# Patient Record
Sex: Female | Born: 1945 | Race: Black or African American | Hispanic: No | Marital: Single | Smoking: Never smoker
Health system: Southern US, Community
[De-identification: ages and names within clinical notes are randomized; demographics above are authoritative.]

## PROBLEM LIST (undated history)

## (undated) DIAGNOSIS — Z923 Personal history of irradiation: Secondary | ICD-10-CM

## (undated) DIAGNOSIS — T7840XA Allergy, unspecified, initial encounter: Secondary | ICD-10-CM

## (undated) DIAGNOSIS — Z9221 Personal history of antineoplastic chemotherapy: Secondary | ICD-10-CM

## (undated) DIAGNOSIS — K635 Polyp of colon: Secondary | ICD-10-CM

## (undated) DIAGNOSIS — C801 Malignant (primary) neoplasm, unspecified: Secondary | ICD-10-CM

## (undated) DIAGNOSIS — J189 Pneumonia, unspecified organism: Secondary | ICD-10-CM

## (undated) DIAGNOSIS — D691 Qualitative platelet defects: Secondary | ICD-10-CM

## (undated) DIAGNOSIS — G620 Drug-induced polyneuropathy: Secondary | ICD-10-CM

## (undated) DIAGNOSIS — D649 Anemia, unspecified: Secondary | ICD-10-CM

## (undated) DIAGNOSIS — R599 Enlarged lymph nodes, unspecified: Secondary | ICD-10-CM

## (undated) DIAGNOSIS — K8020 Calculus of gallbladder without cholecystitis without obstruction: Secondary | ICD-10-CM

## (undated) DIAGNOSIS — M1990 Unspecified osteoarthritis, unspecified site: Secondary | ICD-10-CM

## (undated) HISTORY — PX: COLONOSCOPY: SHX174

## (undated) HISTORY — DX: Allergy, unspecified, initial encounter: T78.40XA

## (undated) HISTORY — DX: Calculus of gallbladder without cholecystitis without obstruction: K80.20

## (undated) HISTORY — DX: Anemia, unspecified: D64.9

## (undated) HISTORY — DX: Enlarged lymph nodes, unspecified: R59.9

## (undated) HISTORY — DX: Unspecified osteoarthritis, unspecified site: M19.90

## (undated) HISTORY — DX: Pneumonia, unspecified organism: J18.9

## (undated) HISTORY — DX: Polyp of colon: K63.5

## (undated) HISTORY — DX: Qualitative platelet defects: D69.1

## (undated) HISTORY — DX: Drug-induced polyneuropathy: G62.0

---

## 1973-10-22 HISTORY — PX: TONSILLECTOMY: SUR1361

## 1986-10-22 HISTORY — PX: ABDOMINAL HYSTERECTOMY: SHX81

## 1995-10-23 DIAGNOSIS — C801 Malignant (primary) neoplasm, unspecified: Secondary | ICD-10-CM

## 1995-10-23 HISTORY — DX: Malignant (primary) neoplasm, unspecified: C80.1

## 1996-10-22 HISTORY — PX: BREAST SURGERY: SHX581

## 1996-10-22 HISTORY — PX: BREAST LUMPECTOMY: SHX2

## 1998-10-20 ENCOUNTER — Other Ambulatory Visit: Admission: RE | Admit: 1998-10-20 | Discharge: 1998-10-20 | Payer: Self-pay | Admitting: Obstetrics and Gynecology

## 1999-10-11 ENCOUNTER — Encounter: Payer: Self-pay | Admitting: Obstetrics and Gynecology

## 1999-10-11 ENCOUNTER — Encounter: Admission: RE | Admit: 1999-10-11 | Discharge: 1999-10-11 | Payer: Self-pay | Admitting: Obstetrics and Gynecology

## 1999-10-20 ENCOUNTER — Other Ambulatory Visit: Admission: RE | Admit: 1999-10-20 | Discharge: 1999-10-20 | Payer: Self-pay | Admitting: Obstetrics and Gynecology

## 2000-05-30 ENCOUNTER — Encounter: Admission: RE | Admit: 2000-05-30 | Discharge: 2000-05-30 | Payer: Self-pay | Admitting: Family Medicine

## 2000-07-12 ENCOUNTER — Encounter: Admission: RE | Admit: 2000-07-12 | Discharge: 2000-09-05 | Payer: Self-pay | Admitting: General Surgery

## 2000-10-03 ENCOUNTER — Other Ambulatory Visit: Admission: RE | Admit: 2000-10-03 | Discharge: 2000-10-03 | Payer: Self-pay | Admitting: Obstetrics and Gynecology

## 2000-10-08 ENCOUNTER — Encounter: Admission: RE | Admit: 2000-10-08 | Discharge: 2000-10-08 | Payer: Self-pay | Admitting: Family Medicine

## 2001-05-23 ENCOUNTER — Encounter (HOSPITAL_BASED_OUTPATIENT_CLINIC_OR_DEPARTMENT_OTHER): Payer: Self-pay | Admitting: General Surgery

## 2001-05-23 ENCOUNTER — Encounter: Admission: RE | Admit: 2001-05-23 | Discharge: 2001-05-23 | Payer: Self-pay | Admitting: General Surgery

## 2001-06-26 ENCOUNTER — Encounter (INDEPENDENT_AMBULATORY_CARE_PROVIDER_SITE_OTHER): Payer: Self-pay | Admitting: *Deleted

## 2001-06-26 ENCOUNTER — Ambulatory Visit (HOSPITAL_COMMUNITY): Admission: RE | Admit: 2001-06-26 | Discharge: 2001-06-26 | Payer: Self-pay | Admitting: General Surgery

## 2001-12-16 ENCOUNTER — Encounter: Admission: RE | Admit: 2001-12-16 | Discharge: 2001-12-16 | Payer: Self-pay | Admitting: Internal Medicine

## 2001-12-16 ENCOUNTER — Encounter: Payer: Self-pay | Admitting: Internal Medicine

## 2001-12-18 ENCOUNTER — Encounter: Admission: RE | Admit: 2001-12-18 | Discharge: 2001-12-18 | Payer: Self-pay | Admitting: Internal Medicine

## 2001-12-18 ENCOUNTER — Encounter: Payer: Self-pay | Admitting: Internal Medicine

## 2002-05-05 ENCOUNTER — Other Ambulatory Visit: Admission: RE | Admit: 2002-05-05 | Discharge: 2002-05-05 | Payer: Self-pay | Admitting: Obstetrics and Gynecology

## 2002-07-29 ENCOUNTER — Encounter: Payer: Self-pay | Admitting: Emergency Medicine

## 2002-07-29 ENCOUNTER — Emergency Department (HOSPITAL_COMMUNITY): Admission: EM | Admit: 2002-07-29 | Discharge: 2002-07-30 | Payer: Self-pay | Admitting: Emergency Medicine

## 2003-06-22 ENCOUNTER — Inpatient Hospital Stay (HOSPITAL_COMMUNITY): Admission: EM | Admit: 2003-06-22 | Discharge: 2003-06-25 | Payer: Self-pay | Admitting: Emergency Medicine

## 2003-06-22 ENCOUNTER — Encounter: Payer: Self-pay | Admitting: Emergency Medicine

## 2004-11-23 ENCOUNTER — Encounter: Admission: RE | Admit: 2004-11-23 | Discharge: 2004-11-23 | Payer: Self-pay | Admitting: Internal Medicine

## 2006-01-30 ENCOUNTER — Encounter: Admission: RE | Admit: 2006-01-30 | Discharge: 2006-01-30 | Payer: Self-pay | Admitting: Obstetrics and Gynecology

## 2006-02-21 ENCOUNTER — Encounter: Admission: RE | Admit: 2006-02-21 | Discharge: 2006-02-21 | Payer: Self-pay | Admitting: Obstetrics and Gynecology

## 2006-10-22 HISTORY — PX: CHOLECYSTECTOMY: SHX55

## 2007-04-16 ENCOUNTER — Encounter (INDEPENDENT_AMBULATORY_CARE_PROVIDER_SITE_OTHER): Payer: Self-pay | Admitting: General Surgery

## 2007-04-16 ENCOUNTER — Observation Stay (HOSPITAL_COMMUNITY): Admission: RE | Admit: 2007-04-16 | Discharge: 2007-04-17 | Payer: Self-pay | Admitting: General Surgery

## 2008-08-03 ENCOUNTER — Encounter: Admission: RE | Admit: 2008-08-03 | Discharge: 2008-08-03 | Payer: Self-pay | Admitting: Obstetrics and Gynecology

## 2009-09-27 ENCOUNTER — Encounter: Admission: RE | Admit: 2009-09-27 | Discharge: 2009-09-27 | Payer: Self-pay | Admitting: Internal Medicine

## 2010-05-22 ENCOUNTER — Emergency Department (HOSPITAL_COMMUNITY): Admission: EM | Admit: 2010-05-22 | Discharge: 2010-05-22 | Payer: Self-pay | Admitting: Family Medicine

## 2010-05-23 ENCOUNTER — Encounter: Admission: RE | Admit: 2010-05-23 | Discharge: 2010-05-23 | Payer: Self-pay | Admitting: Otolaryngology

## 2010-10-03 ENCOUNTER — Encounter
Admission: RE | Admit: 2010-10-03 | Discharge: 2010-10-03 | Payer: Self-pay | Source: Home / Self Care | Attending: Internal Medicine | Admitting: Internal Medicine

## 2011-03-06 NOTE — H&P (Signed)
NAMESEYNABOU, [name]               ACCOUNT NO.:  [id number]   MEDICAL RECORD NO.:  [id number]          PATIENT TYPE:  OUT   LOCATION:  DFTL                         FACILITY:  [facility]   PHYSICIAN:  [name], M.D.DATE OF BIRTH:  05/22/1946   DATE OF ADMISSION:  04/16/2007  DATE OF DISCHARGE:                              HISTORY & PHYSICAL   CHIEF COMPLAINT:  Right upper quadrant abdominal pain.   PRESENT ILLNESS:  [name] is a 60-year black female who  approximately three to four days ago had the onset of aching, right  upper quadrant abdominal pain with nausea.  This initially was not  particularly severe and she treated this with over-the-counter  medication.  The pain persisted, however, and last night became much  more severe.  She describes constant severe aching under her right rib  cage associated with nausea.  It is worse with moving.  She has loss of  appetite.  She has no similar previous symptoms.  She was seen at  [facility] stating that gallbladder ultrasound was  obtained showing evidence of acute cholecystitis as described below.   PAST MEDICAL HISTORY/SURGERY:  1. History of left breast cancer status post lumpectomy, axillary      dissection, radiation chemotherapy 10 years ago.  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  2. Tonsillectomy.  3. Hysterectomy.  4. Medical, she denies serious medical illness or hospitalizations.   MEDICATIONS:  1. P.r.n. Lasix for her arm swelling.   ALLERGIES:  CODEINE.   SOCIAL HISTORY:  She is divorced.  No cigarette or alcohol use.   FAMILY HISTORY:  She has a sister status post cholecystectomy.   REVIEW OF SYSTEMS:  GENERAL:  She felt somewhat feverish today, weight  stable.  RESPIRATORY:  Denies shortness breath, cough, wheezing.  CARDIAC: Denies chest pain, palpitations, history of heart disease.  ABDOMEN/GI as above.  GU: No urinary burning, frequency.   PHYSICAL  EXAMINATION:  VITAL SIGNS:  She is afebrile.  Heart rate 88,  respirations 14, blood pressure 169/94.  GENERAL:  Mildly obese black female appears uncomfortable.  SKIN:  Warm, dry.  No rash or infection.  HEENT: No palpable masses or thyromegaly.  Sclerae nonicteric.  Nares,  oropharynx clear.  LYMPHS:  Lymph nodes no cervical, subclavicular, axillary or inguinal  nodes palpable.  BREASTS:  Status post lumpectomy on the left.  No palpable masses in  either breast.  RESPIRATORY:  No shortness of breath, cough, wheezing.  CARDIAC:  Regular rate and rhythm.  No murmurs.  There is 3+ edema of  the left arm.  ABDOMEN:  Local tenderness in the right upper quadrant without guarding,  masses or peritoneal signs.  EXTREMITIES:  Lymphedema left arm as noted above.  NEUROLOGIC:  Alert, oriented, motor and sensory exams grossly normal.   LABORATORY:  CBC, LFTs, lipase, UA all within normal limits.  Ultrasound  of the gallbladder shows multiple large gallstones with a stone impacted  in the neck of the gallbladder and thickening of the gallbladder wall,  no  pericholecystic fluid, common bile duct normal.   ASSESSMENT/PLAN:  Cholelithiasis, probable acute cholecystitis.  The  patient will be admitted up.  She had been treated with intravenous  fluids, antibiotics will be taken urgently to operating room for  laparoscopic cholecystectomy.      [name], M.D.  Electronically Signed     BTH/MEDQ  D:  04/16/2007  T:  04/17/2007  Job:  [id number]

## 2011-03-06 NOTE — Op Note (Signed)
[name], [name]               ACCOUNT NO.:  [id number]   MEDICAL RECORD NO.:  [id number]          PATIENT TYPE:  OUT   LOCATION:  DFTL                         FACILITY:  [facility]   PHYSICIAN:  Sharlet Salina T. Hoxworth, M.D.DATE OF BIRTH:  1946-03-29   DATE OF PROCEDURE:  04/16/2007  DATE OF DISCHARGE:                               OPERATIVE REPORT   PRE-AND-POSTOPERATIVE DIAGNOSIS:  Cholelithiasis and acute  cholecystitis.   SURGICAL PROCEDURES:  Laparoscopic cholecystectomy with intraoperative  cholangiogram.   SURGEON:  Sharlet Salina T. Hoxworth, M.D.   ANESTHESIA:  General.   BRIEF HISTORY:  [name] is a 65 year old female who presents with  persistent right upper quadrant abdominal pain and nausea.  She had a  gallbladder ultrasound, today, showing multiple gallstones and  thickening of the gallbladder wall.  LFTs are normal.  She is felt to  have acute cholecystitis and laparoscopic cholecystectomy with  cholangiogram has been recommend and accepted.  The nature of the  procedure, its indications, the risks of bleeding, infection, bile leak  and bile duct injury were discussed and understood. She is now brought  to the operating room for this procedure.   DESCRIPTION OF OPERATION:  The patient brought to the operating room and  placed in the supine position on the operating table; and general  orotracheal anesthesia was induced.  The abdomen was widely sterilely  prepped and draped.  PAS were in place.  She received preoperative  antibiotics.  The correct patient and procedure were verified.   Local anesthesia was used to infiltrate the trocar sites.  A 1-cm  incision was made at the umbilicus.  Dissection was carried down through  the subcutaneous tissue to the midline fascia which was sharply incised  transversely for 1 cm where the peritoneum was entered under direct  vision.  Through a mattress suture of #0 Vicryl the Hasson trocar was  placed and pneumoperitoneum  established.  Under direct vision, a 10-mm  trocar was placed in the subxiphoid area; and two 5-mm trocars on the  right subcostal margin.   The gallbladder was acutely inflamed and edematous.  It was tense and  was decompressed with the aspiration needle, obtaining clear bile.  The  fundus was then grasped and elevated up over the liver.  Filmy adhesions  were taken down off the infundibulum which was retracted  inferolaterally.  Peritoneum anterior and posterior to Calot's triangle  was incised; and fibrofatty tissue was stripped off the neck of the  gallbladder toward the porta hepatis.   The cystic duct gallbladder junction was identified and dissected 360  degrees; and the cystic duct dissected out over about a centimeter.  When the anatomy was clear, the cystic duct was clipped at the  gallbladder junction and an operative cholangiogram obtained through the  cystic duct.  This showed good filling of normal common bile duct and  intrahepatic ducts with free flow into the duodenum and no filling  defects.  Following this the cystic duct was doubly clipped proximally  and divided.  The cystic artery clearly seen coursing up on the  gallbladder wall  was divided between two proximal and one distal clip.  The gallbladder was then dissected free from its bed using hook cautery.  It was placed in an EndoCatch bag and brought out through the umbilicus  after extracting a number of large stones.   The gallbladder bed was irrigated and complete hemostasis was obtained  with cautery.  Surgicel pack was additionally placed.  The end was  inspected for any evidence of trocar injury and none was seen.  The  trocar was removed and all CO2 evacuated.  The mattress suture was  secured to the umbilicus.  Skin incisions were closed with interrupted 4-  0 Monocryl subcuticular and Dermabond.  Sponge, needle, and instrument  counts were correct.  The patient was taken to recovery in good   condition.      Lorne Skeens. Hoxworth, M.D.  Electronically Signed     BTH/MEDQ  D:  04/16/2007  T:  04/17/2007  Job:  [id number]

## 2011-03-09 NOTE — Discharge Summary (Signed)
NAME:  [name], BACHTEL                         ACCOUNT NO.:  [id number]   MEDICAL RECORD NO.:  [id number]                   PATIENT TYPE:  INP   LOCATION:  0472                                 FACILITY:  [facility]   PHYSICIAN:  Maxwell Caul, M.D.             DATE OF BIRTH:  12/05/1945   DATE OF ADMISSION:  06/22/2003  DATE OF DISCHARGE:  06/25/2003                                 DISCHARGE SUMMARY   CHIEF COMPLAINT:  Vomiting and chills.   DISCHARGE DIAGNOSES:  1. Left arm cellulitis/phlebitis, improving current antibiotic therapy.     a. An upper extremity arterial Doppler study found no evidence of deep        venous thrombosis or superficial thrombus.  2. Acute renal insufficiency/dehydration with hypokalemia secondary to     nausea and vomiting.  Hypokalemia secondary to nausea and vomiting.     Intravenous fluids, Lasix prior to admission.     a. Renal function normalized to baseline and potassium 3.7 at discharge.  3. Nausea and vomiting secondary to infection and possible esophagitis,     resolved.  No complaints at discharge.     a. Placed on proton pump inhibitor, Protonix.  Will continue post        discharge.  4. Mild normocytic anemia with likely component iron deficiency.     a. Started on iron replacement prior to discharge.  5. History left breast cancer with previous lumpectomy status post radiation     therapy, status post chemotherapy.     a. Multiple lymph node dissection during lumpectomy procedure in 1998.     b. Followed as an outpatient by oncology.  6. Previous surgical history includes tonsillectomy, partial hysterectomy     1988 secondary to fibroids, lumpectomy.  7. Allergies listed Darvocet.   DISCHARGE MEDICATIONS:  1. Protonix 40 mg daily.  2. Keflex 500 mg q.i.d. for 10 days post discharge.  3. Ferrous sulfate 325 mg b.i.d.  4. The patient to resume Lasix at previous dosage.   SPECIAL DISCHARGE INSTRUCTIONS:  1. Disposition:  The patient  discharged home where she lives independently.  2. Activity:  Unrestricted.  3. Diet:  Regular.  4. Follow up with Dr. Andrey Campanile at [facility] 10-14 days post     discharge.  5. Follow up with her oncologist as previously arranged.  6. Next office visit [facility] would follow with basic     metabolic panel and a CBC.  Reevaluate serum potassium, electrolytes,     renal function, anemia.  7. The patient to wrap left upper extremity and utilize compression sleeve     as previously ordered by surgery and oncology for left arm lymphedema.   CONSULTS:  None.   PROCEDURES:  Upper extremity arterial Doppler study:  Negative deep venous  thrombosis done June 22, 2003.   DISCHARGE LABORATORIES:  A CBC done June 25, 2003 found WBC 4.4,  hemoglobin 11.1, hematocrit 32.9, platelets 151,000.  Blood cultures  produced no growth over the course of hospitalization.  Stool for occult  blood negative.  B12 and folate levels are pending.  Anemia iron study found  iron low 18, binding capacity low 216, iron saturation low 8.  Chest x-ray  on admission noted marked accentuated left upper lobe with no definite  pneumonia present.   For dictated admission history of present illness, past medical history,  medications, social history, family history, review of systems, physical  examination, laboratory data, impressions, and plan, please see dictated  admission history and physical dated June 22, 2003.   HOSPITAL COURSE:  Problem 1 - LEFT ARM CELLULITIS/PHLEBITIS:  This 65-year-  old female is followed as an outpatient in primary care by Dr. Andrey Campanile at  [facility].  She has a history of left breast cancer and  previous lumpectomy with chronic lymphedema status post multiple lymph node  dissection.  She experienced sudden onset vomiting, chills on June 21, 2003.  She came to the emergency room for the vomiting where she was noted  to be febrile  101 and noted to have a severe cellulitis left upper  extremity.  She had a similar episode of cellulitis left upper extremity  approximately one year ago.  Following her previous lumpectomy, she had  radiation and chemotherapy in 2001.  She denied cough, dysuria, or other  complaints.  She was admitted for further evaluation and treatment.  She was  placed on IV antibiotic therapy with Rocephin initially and then switched to  Ancef.  With antibiotic therapy her fever and leukocytosis resolved.  There  was mild thrombocytopenia and this was thought secondary to her infection.  She was switched to oral Keflex and remains afebrile.  Discharge WBC  normalized to 4.4.  She will complete a total 14-day course of antibiotic  therapy post discharge with Keflex.  She is to follow with Dr. Andrey Campanile at  [facility] in 10-14 days.  The patient did have significant  erythema, warmth, increased, pain in the left upper extremity and this all  resolved.  A venous Doppler study was done June 22, 2003 and this found no  indication of DVT or superficial thrombus.  The patient continues to have  significant left upper extremity lymphedema.  She utilizes a wrap and sleeve  for this as a home treatment and she states she will initiate this with  return to home.  She was also taking diuretics with Lasix at home and she  will resume this as previous.  She is to follow up with her oncologist as  previously arranged.   Problem 2 - DEHYDRATION AND HYPOKALEMIA:  On presentation the patient was  having nausea and vomiting.  She was unable to tolerate oral food and fluids  well.  She was mildly dehydrated, BUN 9 and creatinine 0.8.  She was given  gentle IV hydration until her nausea and vomiting resolved.  She is now  taking oral food and fluids adequately.  BUN and creatinine improved to 6  and 0.7, respectively.  She had hypokalemia.  Serum potassium 3.2 on admission.  This was thought secondary to  combination nausea and vomiting,  IV hydration, use of diuretics at home.  She was replaced in her potassium  and this normalized to 3.8 and is stable at discharge.  Would follow up with  a basic metabolic panel next office visit at [facility].   Problem 3 -  NAUSEA AND VOMITING THOUGHT SECONDARY TO INFECTION AND POSSIBLE  ESOPHAGITIS:  The patient was placed on b.i.d. proton pump inhibitor.  Her  symptomatology resolved with antibiotic therapy and the proton pump  inhibitor.  I have continued Protonix 40 mg daily post discharge.   Problem 4 - MILD NORMOCYTIC ANEMIA:  On presentation hemoglobin was 12.0.  This dropped to a hemoglobin 10.9 with hydration.  An anemia panel was  obtained with all iron indices low.  B12 and RBC folate pending.  Ferritin  was elevated as would be expected during an acute infective process.  I have  placed her on iron replacement with ferrous sulfate 325 mg b.i.d.  Would  follow with a CBC next visit [facility].  Stool was checked  for occult blood and this was negative.  There has been no sign of acute  bleeding here.   Problem 5 - HISTORY LEFT BREAST CANCER AND SIGNIFICANT LEFT UPPER EXTREMITY  LYMPHEDEMA STATUS POST LUMPECTOMY AND MULTIPLE LYMPH NODE DISSECTION:  This  as above and patient to follow with hematology/oncology as previously  arranged.   CONDITION ON DISCHARGE:  Stable/improved.   DISPOSITION:  Returning home where she lives independently.   ACTIVITY:  No restriction.   DISCHARGE PROCESS:  Greater than 30 minutes 8:55 a.m. through 9:40 a.m.     Everett Graff, N.P.                     Maxwell Caul, M.D.    TC/MEDQ  D:  06/25/2003  T:  06/25/2003  Job:  [id number]   cc:   Andrey Campanile, M.D.  [facility]

## 2011-03-09 NOTE — H&P (Signed)
NAME:  [name], [name]                         ACCOUNT NO.:  [id number]   MEDICAL RECORD NO.:  [id number]                   PATIENT TYPE:  OBV   LOCATION:  0447                                 FACILITY:  [facility]   PHYSICIAN:  Evelena Peat, M.D.               DATE OF BIRTH:  1946/02/10   DATE OF ADMISSION:  06/22/2003  DATE OF DISCHARGE:                                HISTORY & PHYSICAL   PRIMARY PHYSICIAN:  Gloriajean Dell. Andrey Campanile, M.D.   CHIEF COMPLAINT:  Vomiting and chills.   HISTORY OF PRESENT ILLNESS:  This is a 65 year old divorced black female  with history of left breast cancer and previous lumpectomy and chronic  lymphedema who had onset of vomiting and chills around 10 p.m. on June 21, 2003.  Her vomiting actually prompted her ER visit and here was noted to  have a fever of 101 and apparently severe cellulitis of the left upper  extremity.  She had had similar episode of cellulitis involving the left  upper extremity about a year ago.  She has had previous lumpectomy as well  as radiation therapy and chemotherapy in the year 2001.  The patient is not  sure when her left upper extremity erythema and warmth were first noted.  She has not had any recent concerns regarding any cough, dysuria, or other  complaints.   PAST MEDICAL HISTORY:  Left breast cancer diagnosed November 2001 with  previous lumpectomy, radiation therapy, and chemotherapy.   PAST SURGICAL HISTORY:  1. Tonsillectomy.  2. Partial hysterectomy 1988 secondary to fibroids.   ALLERGIES:  DARVON causes nausea.   MEDICATIONS:  Lasix orally for lymphedema, dose unknown at this time.   SOCIAL HISTORY:  She is divorced.  She has one daughter age 22 who lives in  [place].  She works at [employer].  She lives with her father in [place].  She is a nonsmoker.  No alcohol  use.   FAMILY HISTORY:  Father has history of hypertension and diabetes.  Mother  has congestive  heart failure.  She had a grandmother with diabetes as well.   REVIEW OF SYSTEMS:  Denies any recent headache, chest pain, dyspnea, cough,  abdominal pain, dysuria, or diarrhea.  She had some transient soreness in  her upper chest region after vomiting, but no hematemesis.   PHYSICAL EXAMINATION:  VITAL SIGNS:  Temperature 101, blood pressure 145/84,  pulse 96, respirations 20, O2 saturation 99% room air.  GENERAL:  The patient is lying supine, sedated after morphine, in no  apparent distress.  SKIN:  Left upper extremity is edematous with increased warmth to touch and  erythema which involves the dorsum of the left hand, the left forearm, left  arm, and extends all the way to the axilla.  HEENT:  Oropharynx is slightly dry, otherwise clear.  TMs are normal.  Pupils are about 2 mm  and minimally reactive following morphine.  Fundi not  seen.  NECK:  Supple without adenopathy.  CHEST:  Clear to auscultation.  HEART:  Regular rhythm and rate with no murmur.  ABDOMEN:  Soft and nontender with no masses.  She has normal bowel sounds.  BREASTS:  Deferred.  PELVIC:  Deferred.  EXTREMITIES:  Left upper extremity as above.  She has normal distal pulses  throughout her upper and lower extremities.  There is no lower extremity  edema.   LABORATORIES:  White count 11.8, 95% neutrophils, hemoglobin 12.0.  Sodium  130, potassium 3.2, BUN 9, creatinine 0.8, glucose 147.   IMPRESSION:  A 65 year old black female with cellulitis involving left upper  extremity.  Her situation is complicated by chronic left upper extremity  lymphedema secondary to previous breast surgery.  She has had some nausea  and vomiting and is unable to keep down oral medications or fluids at this  time.   PLAN:  Will admit and continue IV fluids and IV antibiotics.  She received  Rocephin 1 g IV in the emergency department.  Blood cultures were obtained  and pending.  Hopefully, she can be transitioned to oral antibiotics  soon.                                               Evelena Peat, M.D.    BB/MEDQ  D:  06/22/2003  T:  06/22/2003  Job:  [id number]   cc:   Gloriajean Dell. Andrey Campanile, M.D.  [address]  [place]  [place] [address]  Fax: [phone]   Leonie Man, M.D.  [address]  [place]  [place] [address]  Fax: [phone]

## 2011-04-10 ENCOUNTER — Ambulatory Visit
Admission: RE | Admit: 2011-04-10 | Discharge: 2011-04-10 | Disposition: A | Discharge status: Home / Self Care | Payer: Self-pay | Source: Ambulatory Visit | Attending: Family Medicine | Admitting: Family Medicine

## 2011-04-10 ENCOUNTER — Other Ambulatory Visit: Payer: Self-pay | Admitting: Family Medicine

## 2011-08-08 LAB — CBC
HCT: 42
Hemoglobin: 13.9
MCV: 85.8
Platelets: 187
RBC: 4.9
WBC: 6.3

## 2011-08-08 LAB — COMPREHENSIVE METABOLIC PANEL
Albumin: 4.1
Alkaline Phosphatase: 101
BUN: 6
CO2: 24
Chloride: 102
Creatinine, Ser: 0.56
GFR calc non Af Amer: >60
Potassium: 3.6
Total Bilirubin: 0.8

## 2011-08-08 LAB — URINALYSIS, ROUTINE W REFLEX MICROSCOPIC
Nitrite: NEGATIVE
Protein, ur: NEGATIVE
Urobilinogen, UA: 0.2

## 2011-08-08 LAB — URINE MICROSCOPIC-ADD ON

## 2011-10-18 ENCOUNTER — Other Ambulatory Visit: Payer: Self-pay | Admitting: Obstetrics and Gynecology

## 2011-10-18 DIAGNOSIS — Z780 Asymptomatic menopausal state: Secondary | ICD-10-CM

## 2011-10-18 DIAGNOSIS — Z1231 Encounter for screening mammogram for malignant neoplasm of breast: Secondary | ICD-10-CM

## 2011-11-01 ENCOUNTER — Ambulatory Visit
Admission: RE | Admit: 2011-11-01 | Discharge: 2011-11-01 | Disposition: A | Discharge status: Home / Self Care | Payer: BC Managed Care – PPO | Source: Ambulatory Visit | Attending: Obstetrics and Gynecology | Admitting: Obstetrics and Gynecology

## 2011-11-01 DIAGNOSIS — Z1231 Encounter for screening mammogram for malignant neoplasm of breast: Secondary | ICD-10-CM

## 2011-11-05 ENCOUNTER — Other Ambulatory Visit: Payer: BC Managed Care – PPO

## 2011-11-05 ENCOUNTER — Ambulatory Visit: Payer: BC Managed Care – PPO

## 2011-11-05 ENCOUNTER — Ambulatory Visit
Admission: RE | Admit: 2011-11-05 | Discharge: 2011-11-05 | Disposition: A | Discharge status: Home / Self Care | Payer: BC Managed Care – PPO | Source: Ambulatory Visit | Attending: Obstetrics and Gynecology | Admitting: Obstetrics and Gynecology

## 2011-11-05 DIAGNOSIS — Z780 Asymptomatic menopausal state: Secondary | ICD-10-CM

## 2012-10-03 ENCOUNTER — Other Ambulatory Visit: Payer: Self-pay | Admitting: Internal Medicine

## 2012-10-03 DIAGNOSIS — Z1231 Encounter for screening mammogram for malignant neoplasm of breast: Secondary | ICD-10-CM

## 2012-11-04 ENCOUNTER — Ambulatory Visit
Admission: RE | Admit: 2012-11-04 | Discharge: 2012-11-04 | Disposition: A | Discharge status: Home / Self Care | Payer: 59 | Source: Ambulatory Visit | Attending: Internal Medicine | Admitting: Internal Medicine

## 2012-11-04 DIAGNOSIS — Z1231 Encounter for screening mammogram for malignant neoplasm of breast: Secondary | ICD-10-CM

## 2012-12-03 ENCOUNTER — Ambulatory Visit: Payer: 59 | Admitting: Physical Therapy

## 2012-12-15 ENCOUNTER — Ambulatory Visit: Payer: 59 | Admitting: Physical Therapy

## 2012-12-16 ENCOUNTER — Ambulatory Visit: Payer: Worker's Compensation | Attending: Family Medicine | Admitting: Physical Therapy

## 2012-12-17 ENCOUNTER — Ambulatory Visit: Payer: 59 | Attending: Family Medicine | Admitting: Physical Therapy

## 2012-12-17 DIAGNOSIS — M25569 Pain in unspecified knee: Secondary | ICD-10-CM | POA: Insufficient documentation

## 2012-12-17 DIAGNOSIS — IMO0001 Reserved for inherently not codable concepts without codable children: Secondary | ICD-10-CM | POA: Insufficient documentation

## 2013-12-14 ENCOUNTER — Other Ambulatory Visit: Payer: Self-pay

## 2013-12-14 DIAGNOSIS — Z1231 Encounter for screening mammogram for malignant neoplasm of breast: Secondary | ICD-10-CM

## 2013-12-24 ENCOUNTER — Ambulatory Visit
Admission: RE | Admit: 2013-12-24 | Discharge: 2013-12-24 | Disposition: A | Discharge status: Home / Self Care | Payer: Medicare Other | Source: Ambulatory Visit

## 2013-12-24 DIAGNOSIS — Z1231 Encounter for screening mammogram for malignant neoplasm of breast: Secondary | ICD-10-CM

## 2014-11-29 ENCOUNTER — Other Ambulatory Visit: Payer: Self-pay

## 2014-11-29 DIAGNOSIS — Z1231 Encounter for screening mammogram for malignant neoplasm of breast: Secondary | ICD-10-CM

## 2014-11-29 DIAGNOSIS — Z853 Personal history of malignant neoplasm of breast: Secondary | ICD-10-CM

## 2014-12-27 ENCOUNTER — Ambulatory Visit: Payer: Medicare Other

## 2015-09-14 ENCOUNTER — Other Ambulatory Visit: Payer: Self-pay | Admitting: Internal Medicine

## 2015-09-14 DIAGNOSIS — Z780 Asymptomatic menopausal state: Secondary | ICD-10-CM

## 2015-10-13 ENCOUNTER — Ambulatory Visit
Admission: RE | Admit: 2015-10-13 | Discharge: 2015-10-13 | Disposition: A | Discharge status: Home / Self Care | Payer: Medicare Other | Source: Ambulatory Visit

## 2015-10-13 DIAGNOSIS — Z853 Personal history of malignant neoplasm of breast: Secondary | ICD-10-CM

## 2015-10-13 DIAGNOSIS — Z1231 Encounter for screening mammogram for malignant neoplasm of breast: Secondary | ICD-10-CM

## 2016-12-12 ENCOUNTER — Emergency Department (HOSPITAL_COMMUNITY): Payer: Medicare Other

## 2016-12-12 ENCOUNTER — Encounter (HOSPITAL_COMMUNITY): Payer: Self-pay | Admitting: *Deleted

## 2016-12-12 ENCOUNTER — Observation Stay (HOSPITAL_COMMUNITY)
Admission: EM | Admit: 2016-12-12 | Discharge: 2016-12-13 | Disposition: A | Discharge status: Home / Self Care | Payer: Medicare Other | Attending: Internal Medicine | Admitting: Internal Medicine

## 2016-12-12 ENCOUNTER — Observation Stay (HOSPITAL_BASED_OUTPATIENT_CLINIC_OR_DEPARTMENT_OTHER): Payer: Medicare Other

## 2016-12-12 DIAGNOSIS — Z853 Personal history of malignant neoplasm of breast: Secondary | ICD-10-CM | POA: Diagnosis not present

## 2016-12-12 DIAGNOSIS — R0781 Pleurodynia: Secondary | ICD-10-CM | POA: Diagnosis not present

## 2016-12-12 DIAGNOSIS — I10 Essential (primary) hypertension: Secondary | ICD-10-CM | POA: Diagnosis not present

## 2016-12-12 DIAGNOSIS — E785 Hyperlipidemia, unspecified: Secondary | ICD-10-CM | POA: Insufficient documentation

## 2016-12-12 DIAGNOSIS — K219 Gastro-esophageal reflux disease without esophagitis: Secondary | ICD-10-CM

## 2016-12-12 DIAGNOSIS — R091 Pleurisy: Secondary | ICD-10-CM | POA: Diagnosis not present

## 2016-12-12 DIAGNOSIS — Z23 Encounter for immunization: Secondary | ICD-10-CM | POA: Diagnosis not present

## 2016-12-12 DIAGNOSIS — D696 Thrombocytopenia, unspecified: Secondary | ICD-10-CM | POA: Diagnosis present

## 2016-12-12 DIAGNOSIS — I4510 Unspecified right bundle-branch block: Secondary | ICD-10-CM | POA: Insufficient documentation

## 2016-12-12 DIAGNOSIS — R079 Chest pain, unspecified: Secondary | ICD-10-CM | POA: Diagnosis present

## 2016-12-12 DIAGNOSIS — E784 Other hyperlipidemia: Secondary | ICD-10-CM | POA: Diagnosis not present

## 2016-12-12 DIAGNOSIS — Z8249 Family history of ischemic heart disease and other diseases of the circulatory system: Secondary | ICD-10-CM | POA: Diagnosis not present

## 2016-12-12 DIAGNOSIS — R072 Precordial pain: Secondary | ICD-10-CM | POA: Diagnosis not present

## 2016-12-12 DIAGNOSIS — J111 Influenza due to unidentified influenza virus with other respiratory manifestations: Secondary | ICD-10-CM | POA: Diagnosis not present

## 2016-12-12 DIAGNOSIS — R7989 Other specified abnormal findings of blood chemistry: Secondary | ICD-10-CM | POA: Diagnosis not present

## 2016-12-12 DIAGNOSIS — J101 Influenza due to other identified influenza virus with other respiratory manifestations: Secondary | ICD-10-CM | POA: Diagnosis present

## 2016-12-12 HISTORY — DX: Malignant (primary) neoplasm, unspecified: C80.1

## 2016-12-12 LAB — CBC
HEMATOCRIT: 36.1 % (ref 36.0–46.0)
Hemoglobin: 11.8 g/dL — ABNORMAL LOW (ref 12.0–15.0)
MCH: 28 pg (ref 26.0–34.0)
MCHC: 32.7 g/dL (ref 30.0–36.0)
MCV: 85.7 fL (ref 78.0–100.0)
Platelets: 146 10*3/uL — ABNORMAL LOW (ref 150–400)
RBC: 4.21 MIL/uL (ref 3.87–5.11)
RDW: 14.5 % (ref 11.5–15.5)
WBC: 5.7 10*3/uL (ref 4.0–10.5)

## 2016-12-12 LAB — ECHOCARDIOGRAM COMPLETE
Area-P 1/2: 3.49 cm2
CHL CUP RV SYS PRESS: 28 mmHg
CHL CUP TV REG PEAK VELOCITY: 250 cm/s
E decel time: 215 msec
E/e' ratio: 13.93
FS: 32 % (ref 28–44)
Height: 67 in
IVS/LV PW RATIO, ED: 1.08
LA diam end sys: 37 mm
LA diam index: 1.76 cm/m2
LA vol A4C: 64.5 ml
LA vol: 72.1 mL
LASIZE: 37 mm
LAVOLIN: 34.2 mL/m2
LDCA: 3.14 cm2
LV TDI E'MEDIAL: 6.64
LV dias vol index: 28 mL/m2
LVDIAVOL: 58 mL (ref 46–106)
LVEEAVG: 13.93
LVEEMED: 13.93
LVELAT: 7.07 cm/s
LVOT SV: 100 mL
LVOT VTI: 31.9 cm
LVOT peak grad rest: 9 mmHg
LVOTD: 20 mm
LVOTPV: 153 cm/s
LVSYSVOL: 24 mL (ref 14–42)
LVSYSVOLIN: 11 mL/m2
MV Dec: 215
MV pk A vel: 105 m/s
MV pk E vel: 98.5 m/s
MVPG: 4 mmHg
MVSPHT: 63 ms
PW: 13 mm — AB (ref 0.6–1.1)
RV LATERAL S' VELOCITY: 18.8 cm/s
Simpson's disk: 59
Stroke v: 34 ml
TAPSE: 22.3 mm
TDI e' lateral: 7.07
TR max vel: 250 cm/s
Weight: 3220.48 oz

## 2016-12-12 LAB — I-STAT CG4 LACTIC ACID, ED
LACTIC ACID, VENOUS: 0.8 mmol/L (ref 0.5–1.9)
Lactic Acid, Venous: 0.82 mmol/L (ref 0.5–1.9)

## 2016-12-12 LAB — D-DIMER, QUANTITATIVE (NOT AT ARMC): D DIMER QUANT: 0.73 ug{FEU}/mL — AB (ref 0.00–0.50)

## 2016-12-12 LAB — HEPATIC FUNCTION PANEL
ALBUMIN: 3.6 g/dL (ref 3.5–5.0)
ALK PHOS: 87 U/L (ref 38–126)
ALT: 14 U/L (ref 14–54)
AST: 22 U/L (ref 15–41)
Bilirubin, Direct: <0.1 mg/dL — ABNORMAL LOW (ref 0.1–0.5)
TOTAL PROTEIN: 7.1 g/dL (ref 6.5–8.1)
Total Bilirubin: 0.5 mg/dL (ref 0.3–1.2)

## 2016-12-12 LAB — PROTIME-INR
INR: 1.06
PROTHROMBIN TIME: 13.8 s (ref 11.4–15.2)

## 2016-12-12 LAB — BASIC METABOLIC PANEL
Anion gap: 9 (ref 5–15)
BUN: 7 mg/dL (ref 6–20)
CHLORIDE: 102 mmol/L (ref 101–111)
CO2: 26 mmol/L (ref 22–32)
Calcium: 8.7 mg/dL — ABNORMAL LOW (ref 8.9–10.3)
Creatinine, Ser: 0.78 mg/dL (ref 0.44–1.00)
GFR calc non Af Amer: >60 mL/min (ref >60)
Glucose, Bld: 119 mg/dL — ABNORMAL HIGH (ref 65–99)
POTASSIUM: 3.9 mmol/L (ref 3.5–5.1)
SODIUM: 137 mmol/L (ref 135–145)

## 2016-12-12 LAB — I-STAT TROPONIN, ED
TROPONIN I, POC: 0.01 ng/mL (ref 0.00–0.08)
Troponin i, poc: 0 ng/mL (ref 0.00–0.08)

## 2016-12-12 LAB — LIPASE, BLOOD: LIPASE: 20 U/L (ref 11–51)

## 2016-12-12 LAB — TROPONIN I: Troponin I: <0.03 ng/mL (ref <0.03)

## 2016-12-12 LAB — INFLUENZA PANEL BY PCR (TYPE A & B)
Influenza A By PCR: POSITIVE — AB
Influenza B By PCR: NEGATIVE

## 2016-12-12 MED ORDER — IOPAMIDOL (ISOVUE-370) INJECTION 76%
INTRAVENOUS | Status: AC
  Administered 2016-12-12: 100 mL
  Filled 2016-12-12: qty 100

## 2016-12-12 MED ORDER — MORPHINE SULFATE (PF) 4 MG/ML IV SOLN: 2.0000 mg | INTRAVENOUS | Status: DC | PRN

## 2016-12-12 MED ORDER — ASPIRIN 81 MG PO CHEW
324.0000 mg | CHEWABLE_TABLET | Freq: Once | ORAL | Status: AC
  Administered 2016-12-12: 324 mg via ORAL
  Filled 2016-12-12: qty 4

## 2016-12-12 MED ORDER — OSELTAMIVIR PHOSPHATE 75 MG PO CAPS
75.0000 mg | ORAL_CAPSULE | Freq: Once | ORAL | Status: AC
  Administered 2016-12-12: 75 mg via ORAL
  Filled 2016-12-12: qty 1

## 2016-12-12 MED ORDER — KETOROLAC TROMETHAMINE 15 MG/ML IJ SOLN
15.0000 mg | Freq: Four times a day (QID) | INTRAMUSCULAR | Status: DC
  Administered 2016-12-12 – 2016-12-13 (×5): 15 mg via INTRAVENOUS
  Filled 2016-12-12 (×5): qty 1

## 2016-12-12 MED ORDER — ENOXAPARIN SODIUM 40 MG/0.4ML ~~LOC~~ SOLN
40.0000 mg | SUBCUTANEOUS | Status: DC
  Administered 2016-12-12: 40 mg via SUBCUTANEOUS
  Filled 2016-12-12: qty 0.4

## 2016-12-12 MED ORDER — PANTOPRAZOLE SODIUM 40 MG PO TBEC
40.0000 mg | DELAYED_RELEASE_TABLET | Freq: Every day | ORAL | Status: DC
  Administered 2016-12-12 – 2016-12-13 (×2): 40 mg via ORAL
  Filled 2016-12-12 (×2): qty 1

## 2016-12-12 MED ORDER — SODIUM CHLORIDE 0.9 % IV SOLN
INTRAVENOUS | Status: AC
  Administered 2016-12-12 (×2): via INTRAVENOUS

## 2016-12-12 MED ORDER — ACETAMINOPHEN 325 MG PO TABS
650.0000 mg | ORAL_TABLET | ORAL | Status: DC | PRN
Start: 2016-12-12 — End: 2016-12-13
  Administered 2016-12-12 – 2016-12-13 (×2): 650 mg via ORAL
  Filled 2016-12-12 (×2): qty 2

## 2016-12-12 MED ORDER — OSELTAMIVIR PHOSPHATE 75 MG PO CAPS
75.0000 mg | ORAL_CAPSULE | Freq: Two times a day (BID) | ORAL | Status: DC
  Administered 2016-12-12 – 2016-12-13 (×2): 75 mg via ORAL
  Filled 2016-12-12 (×2): qty 1

## 2016-12-12 MED ORDER — GI COCKTAIL ~~LOC~~: 30.0000 mL | Freq: Four times a day (QID) | ORAL | Status: DC | PRN

## 2016-12-12 MED ORDER — ONDANSETRON HCL 4 MG/2ML IJ SOLN: 4.0000 mg | Freq: Four times a day (QID) | INTRAMUSCULAR | Status: DC | PRN

## 2016-12-12 NOTE — ED Triage Notes (Signed)
Pt c/o mid CP radiating to R arm, onset last night, pt reports productive cough with white sputum, pt reports x 1 vomiting episode, with x1 liquid stool yesterday, pt A&O x4

## 2016-12-12 NOTE — H&P (Signed)
History and Physical    TYLENA Moody [id number] DOB: [birth year] DOA: 12/12/2016   PCP: Woody Seller, MD   Patient coming from/Resides with: Private residence  Admission status: Observation/telemetry-it may be medically necessary to stay a minimum 2 midnights to rule out impending and/or unexpected changes in physiologic status that may differ from initial evaluation performed in the ER and/or at time of admission therefore consider reevaluation of admission status in 24 hours.   Chief Complaint: Chest tightness  HPI: [name] is a 71 y.o. female with medical history significant for hypertension (patient denies), dyslipidemia and remote breast cancer. She reports developing cough that is productive in nature with orange brown sputum, subjective fevers and subsequent tightness of the right arm and chest onset yesterday evening. She's had emesis 1. She is also having tightness in the right shoulder blade radiating to the right arm. Prior to last night she has not had any chest pain at rest or with exertion, no dyspnea on exertion, no orthopnea or other unexplained fatigue.  ED Course:  Vital Signs: BP 143/86 (BP Location: Right Arm)   Pulse 93   Temp 99.2 F (37.3 C) (Oral)   Resp 18   Ht 5\' 7"  (1.702 m)   Wt 90.7 kg (200 lb)   SpO2 98%   BMI 31.32 kg/m  2 view CXR: Probable bronchitis changes without evidence of focal pneumonia CHF or other cardiopulmonary abnormality CTA of chest: No pulmonary embolism, no acute abnormality in the chest Lab data: Sodium 137, potassium 3.9, CO2 26, glucose 119, BUN 7, creatinine 0.78, LFTs are normal, poc opponent negative 2, lactic acid negative 2, white count 5700 which are not obtained, hemoglobin 11.8, platelets 146,000, d-dimer 0.73, coags normal, influenza A+ Medications and treatments: Aspirin 324 mg x 1, Tamiflu 75 mg 1  Review of Systems:  In addition to the HPI above,  No Headache, changes with Vision or hearing, new  weakness, tingling, numbness in any extremity, dizziness, dysarthria or word finding difficulty, gait disturbance or imbalance, tremors or seizure activity No problems swallowing food or Liquids, indigestion/reflux, choking or coughing while eating, abdominal pain with or after eating No palpitations, orthopnea or DOE No Abdominal pain, melena,hematochezia, dark tarry stools, constipation No dysuria, malodorous urine, hematuria or flank pain No new skin rashes, lesions, masses or bruises, No new joint pains, aches, swelling or redness No recent unintentional weight gain or loss No polyuria, polydypsia or polyphagia   Past Medical History:  Diagnosis Date  . Cancer [facility])     Past Surgical History:  Procedure Laterality Date  . ABDOMINAL HYSTERECTOMY    . BREAST SURGERY    . CHOLECYSTECTOMY    . TONSILLECTOMY      Social History   Social History  . Marital status: Single    Spouse name: N/A  . Number of children: N/A  . Years of education: N/A   Occupational History  . Not on file.   Social History Main Topics  . Smoking status: Never Smoker  . Smokeless tobacco: Never Used  . Alcohol use No  . Drug use: No  . Sexual activity: Not on file   Other Topics Concern  . Not on file   Social History Narrative  . No narrative on file    Mobility: Without assistive devices Work history: Not obtained   Not on File  Family history reviewed -family history of brother with CAD  Prior to Admission medications   Medication Sig Start Date End Date  Taking? Authorizing Provider  furosemide (LASIX) 40 MG tablet Take 40 mg by mouth daily as needed for fluid.   Yes Historical Provider, MD  pseudoephedrine-acetaminophen (TYLENOL SINUS) 30-500 MG TABS tablet Take 1 tablet by mouth every 4 (four) hours as needed.   Yes Historical Provider, MD    Physical Exam: Vitals:   12/12/16 0930 12/12/16 1000 12/12/16 1205 12/12/16 1249  BP: 139/71 138/83 143/86 143/86  Pulse: 86 85 85  93  Resp: 18 15 18 18   Temp:   99.2 F (37.3 C)   TempSrc:   Oral   SpO2: 99% 98% 95% 98%  Weight:      Height:          Constitutional: NAD, calm, comfortable Eyes: PERRL, lids and conjunctivae normal ENMT: Mucous membranes are dry. Posterior pharynx clear of any exudate or lesions.Normal dentition. Audible nasal congestion. Neck: normal, supple, no masses, no thyromegaly Respiratory: clear to auscultation bilaterally, no wheezing, no crackles. Normal respiratory effort. No accessory muscle use.  Cardiovascular: Regular rate and rhythm, no murmurs / rubs / gallops. No extremity edema. 2+ pedal pulses. No carotid bruits.  Abdomen: no tenderness, no masses palpated. No hepatosplenomegaly. Bowel sounds positive.  Musculoskeletal: no clubbing / cyanosis. No joint deformity upper and lower extremities. Good ROM, no contractures. Normal muscle tone.  Skin: no rashes, lesions, ulcers. No induration Neurologic: CN 2-12 grossly intact. Sensation intact, DTR normal. Strength 5/5 x all 4 extremities.  Psychiatric: Normal judgment and insight. Alert and oriented x 3. Normal mood.    Labs on Admission: I have personally reviewed following labs and imaging studies  CBC:  Recent Labs Lab 12/12/16 0743  WBC 5.7  HGB 11.8*  HCT 36.1  MCV 85.7  PLT 123456*   Basic Metabolic Panel:  Recent Labs Lab 12/12/16 0743  NA 137  K 3.9  CL 102  CO2 26  GLUCOSE 119*  BUN 7  CREATININE 0.78  CALCIUM 8.7*   GFR: Estimated Creatinine Clearance: 76.7 mL/min (by C-G formula based on SCr of 0.78 mg/dL). Liver Function Tests:  Recent Labs Lab 12/12/16 0817  AST 22  ALT 14  ALKPHOS 87  BILITOT 0.5  PROT 7.1  ALBUMIN 3.6    Recent Labs Lab 12/12/16 0817  LIPASE 20   No results for input(s): AMMONIA in the last 168 hours. Coagulation Profile:  Recent Labs Lab 12/12/16 0817  INR 1.06   Cardiac Enzymes: No results for input(s): CKTOTAL, CKMB, CKMBINDEX, TROPONINI in the last 168  hours. BNP (last 3 results) No results for input(s): PROBNP in the last 8760 hours. HbA1C: No results for input(s): HGBA1C in the last 72 hours. CBG: No results for input(s): GLUCAP in the last 168 hours. Lipid Profile: No results for input(s): CHOL, HDL, LDLCALC, TRIG, CHOLHDL, LDLDIRECT in the last 72 hours. Thyroid Function Tests: No results for input(s): TSH, T4TOTAL, FREET4, T3FREE, THYROIDAB in the last 72 hours. Anemia Panel: No results for input(s): VITAMINB12, FOLATE, FERRITIN, TIBC, IRON, RETICCTPCT in the last 72 hours. Urine analysis:    Component Value Date/Time   COLORURINE YELLOW 04/16/2007 1949   APPEARANCEUR CLEAR 04/16/2007 1949   LABSPEC 1.023 04/16/2007 1949   PHURINE 6.0 04/16/2007 1949   GLUCOSEU NEGATIVE 04/16/2007 1949   HGBUR SMALL (A) 04/16/2007 1949   BILIRUBINUR SMALL (A) 04/16/2007 1949   KETONESUR 40 (A) 04/16/2007 1949   PROTEINUR NEGATIVE 04/16/2007 1949   UROBILINOGEN 0.2 04/16/2007 1949   NITRITE NEGATIVE 04/16/2007 1949   LEUKOCYTESUR NEGATIVE 04/16/2007  1949   Sepsis Labs: @LABRCNTIP (procalcitonin:4,lacticidven:4) )No results found for this or any previous visit (from the past 240 hour(s)).   Radiological Exams on Admission: Dg Chest 2 View  Result Date: 12/12/2016 CLINICAL DATA:  Chest pain and chills beginning today EXAM: CHEST  2 VIEW COMPARISON:  CT scan of the chest of May 23, 2010 and chest x-ray of April 16, 2007 FINDINGS: The lungs are adequately inflated. There is no focal infiltrate the interstitial markings are slightly increased overall. There is no pleural effusion. The heart and pulmonary vascularity are normal. The mediastinum is normal in width. There surgical clips in the left axillary region. The bony thorax exhibits no acute abnormality. IMPRESSION: Probable chronic bronchitic changes. There is no pneumonia, CHF, nor other acute cardiopulmonary abnormality. Electronically Signed   By: David  Martinique M.D.   On: 12/12/2016 07:39    Ct Angio Chest Pe W And/or Wo Contrast  Result Date: 12/12/2016 CLINICAL DATA:  Chest pain and cough.  Elevated D-dimer EXAM: CT ANGIOGRAPHY CHEST WITH CONTRAST TECHNIQUE: Multidetector CT imaging of the chest was performed using the standard protocol during bolus administration of intravenous contrast. Multiplanar CT image reconstructions and MIPs were obtained to evaluate the vascular anatomy. CONTRAST:  100 mL Isovue 370 IV COMPARISON:  Chest x-ray 12/12/2016 FINDINGS: Cardiovascular: Negative for pulmonary embolism. Pulmonary arteries normal in caliber. Mild atherosclerotic disease in the aortic arch without aneurysm or dissection. Coronary artery calcification. Heart size upper normal. Negative for pericardial effusion. Mediastinum/Nodes: Negative Lungs/Pleura: Pleuroparenchymal scarring in the left upper lobe anteriorly unchanged from CT of 05/23/2010. Negative for pneumonia or effusion. Negative for mass lesion. Mild ground-glass density in the lung bases bilaterally likely atelectasis. Upper Abdomen: Negative Musculoskeletal: Negative Review of the MIP images confirms the above findings. IMPRESSION: Negative for pulmonary embolism.  No acute abnormality in the chest. Left upper lobe scarring is stable. Electronically Signed   By: Franchot Gallo M.D.   On: 12/12/2016 11:05    EKG: (Independently reviewed) sinus rhythm with ventricular rate 94 bpm, QTC 455 ms, normal R-wave rotation, no ST segment or T-wave changes that would be concerning for ischemia although does have some nonspecific flattening of the ST segment in leads 3 and aVF  Assessment/Plan Principal Problem:   Chest pain -Patient presents with reports of chest tightness radiating down the right arm beginning after developed cough and influenza symptoms with subsequent test positive for influenza A -Heart score = 2 -Chest pain not reproducible with breathing or with palpation -For completeness of exam and given family history we'll  cycle troponin and obtain echocardiogram to rule out ischemic etiology -Scheduled Toradol with oral Protonix -Continue telemetry monitoring -No indication for full dose anticoagulation for initiation of beta blocker at this juncture  Active Problems:   Influenza A - Tamiflu 5 days -Supportive care as above -Gentle IV fluids 24 hours    HTN (hypertension) -Patient denies as been diagnosed with hypertension -Takes Lasix prior to admission prn for "fluid"    Positive D dimer -CT chest without evidence of PE    HLD (hyperlipidemia) -Not on medications prior to admission -Fasting lipid panel in a.m.    Thrombocytopenia  -Likely related to acute influenza      DVT prophylaxis: Lovenox  Code Status: Full Family Communication: No family at bedside Disposition Plan: Discharge back to preadmission home environment when medically stable Consults called: None    Jameyah Fennewald L. ANP-BC [facility] Pager [phone]   If 7PM-7AM, please contact night-coverage [email or web address] Password [email or web address]  12/12/2016, 12:57 PM

## 2016-12-12 NOTE — ED Provider Notes (Signed)
[facility] DEPT Provider Note   CSN: [id number] Arrival date & time: 12/12/16  0709     History   Chief Complaint Chief Complaint  Patient presents with  . Chest Pain    HPI [name] is a 71 y.o. female with a past medical history significant for hypertension, hyperlipidemia, and left-sided breast cancer status post treatment and subsequent chronic lymphedema who presents with chest pain, productive cough, chills, nausea/vomiting, and headache. Patient reports that yesterday evening, she began having symptoms. She says that before onset, she had no symptoms. She reports chills and chest discomfort. She describes the chest discomfort as pressure and tightness in her central chest, radiating into her right arm and right leg, 6 out of 10 in severity, worsened with coughing, associated with nausea and vomiting, and gradual in onset. She denies shortness of breath or diaphoresis. She does report a productive cough with orange sputum. She denies history of DVT or PE. She says that she is having a headache with photophobia that feels like a tightness. She denies any constipation, diarrhea, dysuria, frequency, or other urinary symptoms. She denies any recent traumas or falls. She describes the pain as moderate and new.    HPI  Past Medical History:  Diagnosis Date  . Cancer ([place])     There are no active problems to display for this patient.   Past Surgical History:  Procedure Laterality Date  . ABDOMINAL HYSTERECTOMY    . BREAST SURGERY    . CHOLECYSTECTOMY    . TONSILLECTOMY      OB History    No data available       Home Medications    Prior to Admission medications   Not on File    Family History No family history on file.  Social History Social History  Substance Use Topics  . Smoking status: Never Smoker  . Smokeless tobacco: Never Used  . Alcohol use No     Allergies   Patient has no allergy information on record.   Review of Systems Review  of Systems  Constitutional: Positive for chills, fatigue and fever.  HENT: Positive for congestion and rhinorrhea.   Respiratory: Positive for cough, choking, chest tightness and shortness of breath. Negative for wheezing and stridor.   Cardiovascular: Positive for chest pain. Negative for palpitations and leg swelling.  Gastrointestinal: Positive for nausea and vomiting. Negative for abdominal pain and diarrhea.  Genitourinary: Negative for dysuria, flank pain and frequency.  Musculoskeletal: Negative for back pain.  Skin: Negative for rash and wound.  Neurological: Positive for headaches. Negative for light-headedness.  Psychiatric/Behavioral: Negative for agitation.  All other systems reviewed and are negative.    Physical Exam Updated Vital Signs BP 150/74 (BP Location: Right Arm)   Pulse 91   Temp 99.2 F (37.3 C) (Oral)   Resp 14   Ht 5\' 7"  (1.702 m)   Wt 200 lb (90.7 kg)   SpO2 100%   BMI 31.32 kg/m   Physical Exam  Constitutional: She is oriented to person, place, and time. She appears well-developed and well-nourished. No distress.  HENT:  Head: Normocephalic and atraumatic.  Right Ear: External ear normal.  Left Ear: External ear normal.  Nose: Rhinorrhea present.  Mouth/Throat: Oropharynx is clear and moist. No oropharyngeal exudate.  Eyes: Conjunctivae and EOM are normal. Pupils are equal, round, and reactive to light.  Neck: Normal range of motion. Neck supple.  Cardiovascular: Normal rate, regular rhythm and intact distal pulses.   No murmur  heard. Pulmonary/Chest: Effort normal. No stridor. No respiratory distress. She has no wheezes. She has rhonchi. She exhibits no tenderness.  Abdominal: Soft. She exhibits no distension. There is no tenderness. There is no rebound.  Neurological: She is alert and oriented to person, place, and time. She has normal reflexes. She exhibits normal muscle tone. Coordination normal.  Skin: Skin is warm. No rash noted. She is not  diaphoretic. No erythema.  Nursing note and vitals reviewed.    ED Treatments / Results  Labs (all labs ordered are listed, but only abnormal results are displayed) Labs Reviewed  BASIC METABOLIC PANEL - Abnormal; Notable for the following:       Result Value   Glucose, Bld 119 (*)    Calcium 8.7 (*)    All other components within normal limits  CBC - Abnormal; Notable for the following:    Hemoglobin 11.8 (*)    Platelets 146 (*)    All other components within normal limits  D-DIMER, QUANTITATIVE (NOT AT [facility]) - Abnormal; Notable for the following:    D-Dimer, Quant 0.73 (*)    All other components within normal limits  HEPATIC FUNCTION PANEL - Abnormal; Notable for the following:    Bilirubin, Direct <0.1 (*)    All other components within normal limits  INFLUENZA PANEL BY PCR (TYPE A & B) - Abnormal; Notable for the following:    Influenza A By PCR POSITIVE (*)    All other components within normal limits  PROTIME-INR  LIPASE, BLOOD  TROPONIN I  TROPONIN I  TROPONIN I  LIPID PANEL  I-STAT TROPOININ, ED  I-STAT CG4 LACTIC ACID, ED  I-STAT TROPOININ, ED  I-STAT CG4 LACTIC ACID, ED    EKG  EKG Interpretation  Date/Time:  Wednesday December 12 2016 07:16:18 EST Ventricular Rate:  94 PR Interval:  160 QRS Duration: 104 QT Interval:  364 QTC Calculation: 455 R Axis:   36 Text Interpretation:  Normal sinus rhythm Incomplete right bundle branch block Nonspecific ST abnormality Abnormal ECG When compared to prior, no significant changes were seen.  No STEMI Confirmed by Sherry Ruffing MD, [place] [phone]) on 12/12/2016 7:32:05 AM       Radiology Dg Chest 2 View  Result Date: 12/12/2016 CLINICAL DATA:  Chest pain and chills beginning today EXAM: CHEST  2 VIEW COMPARISON:  CT scan of the chest of May 23, 2010 and chest x-ray of April 16, 2007 FINDINGS: The lungs are adequately inflated. There is no focal infiltrate the interstitial markings are slightly increased overall.  There is no pleural effusion. The heart and pulmonary vascularity are normal. The mediastinum is normal in width. There surgical clips in the left axillary region. The bony thorax exhibits no acute abnormality. IMPRESSION: Probable chronic bronchitic changes. There is no pneumonia, CHF, nor other acute cardiopulmonary abnormality. Electronically Signed   By: David  Martinique M.D.   On: 12/12/2016 07:39   Ct Angio Chest Pe W And/or Wo Contrast  Result Date: 12/12/2016 CLINICAL DATA:  Chest pain and cough.  Elevated D-dimer EXAM: CT ANGIOGRAPHY CHEST WITH CONTRAST TECHNIQUE: Multidetector CT imaging of the chest was performed using the standard protocol during bolus administration of intravenous contrast. Multiplanar CT image reconstructions and MIPs were obtained to evaluate the vascular anatomy. CONTRAST:  100 mL Isovue 370 IV COMPARISON:  Chest x-ray 12/12/2016 FINDINGS: Cardiovascular: Negative for pulmonary embolism. Pulmonary arteries normal in caliber. Mild atherosclerotic disease in the aortic arch without aneurysm or dissection. Coronary artery calcification. Heart size upper  normal. Negative for pericardial effusion. Mediastinum/Nodes: Negative Lungs/Pleura: Pleuroparenchymal scarring in the left upper lobe anteriorly unchanged from CT of 05/23/2010. Negative for pneumonia or effusion. Negative for mass lesion. Mild ground-glass density in the lung bases bilaterally likely atelectasis. Upper Abdomen: Negative Musculoskeletal: Negative Review of the MIP images confirms the above findings. IMPRESSION: Negative for pulmonary embolism.  No acute abnormality in the chest. Left upper lobe scarring is stable. Electronically Signed   By: Franchot Gallo M.D.   On: 12/12/2016 11:05    Procedures Procedures (including critical care time)  Medications Ordered in ED Medications  0.9 %  sodium chloride infusion ( Intravenous New Bag/Given 12/12/16 1503)  pantoprazole (PROTONIX) EC tablet 40 mg (40 mg Oral Given  12/12/16 1335)  ketorolac (TORADOL) 15 MG/ML injection 15 mg (15 mg Intravenous Given 12/12/16 1755)  acetaminophen (TYLENOL) tablet 650 mg (650 mg Oral Given 12/12/16 1509)  ondansetron (ZOFRAN) injection 4 mg (not administered)  enoxaparin (LOVENOX) injection 40 mg (not administered)  morphine 4 MG/ML injection 2 mg (not administered)  gi cocktail (Maalox,Lidocaine,Donnatal) (not administered)  oseltamivir (TAMIFLU) capsule 75 mg (not administered)  aspirin chewable tablet 324 mg (324 mg Oral Given 12/12/16 0907)  iopamidol (ISOVUE-370) 76 % injection (100 mLs  Contrast Given 12/12/16 1043)  oseltamivir (TAMIFLU) capsule 75 mg (75 mg Oral Given 12/12/16 1249)     Initial Impression / Assessment and Plan / ED Course  I have reviewed the triage vital signs and the nursing notes.  Pertinent labs & imaging results that were available during my care of the patient were reviewed by me and considered in my medical decision making (see chart for details).     [name] is a 71 y.o. female with a past medical history significant for hypertension, hyperlipidemia, and left-sided breast cancer status post treatment and subsequent chronic lymphedema who presents with chest pain, productive cough, chills, nausea/vomiting, and headache.   History and exam are seen above.  On exam, patient's lungs are clear. Patient chest is nontender. Abdomen is nontender. Patient has a compression sleeve on left arm for her chronic lymphedema. Patient has no numbness, tingling, or weakness of upper extremities. She has symmetric pulses. Patient has no abdominal tenderness. Patient has no focal neurologic deficits. No nuchal rigidity or neck pain.  Based on patient's symptoms, patient workup for etiology of her chest pain. Given the cough, productive sputum, discomfort, pneumonia is considered however PE is also considered given her history of cancer, worsened with coughing and deep breathing, and the productive  sputum. Patient will have d-dimer as well as workup for infection.  Patient was given aspirin during her workup for chest pain. Heart score calculated as a 6.   Suspect pt will need admission for high risk chest pain workup.   Patient found to be flu positive. Patient given Tamiflu as symptoms  Primarily started yesterday. CT imaging showed no evidence of PE.  Given high risk chest pain, despite influenza discovery, patient will be admitted for further chest pain workup.   Final Clinical Impressions(s) / ED Diagnoses   Final diagnoses:  Influenza  Chest pain, unspecified type    Clinical Impression: 1. Influenza   2. Chest pain, unspecified type     Disposition: Admit to hospitalist service    Courtney Paris, MD 12/12/16 2023

## 2016-12-12 NOTE — ED Notes (Signed)
Patient transported to X-ray 

## 2016-12-12 NOTE — Progress Notes (Signed)
*  PRELIMINARY RESULTS* Echocardiogram 2D Echocardiogram has been performed.  [name] 12/12/2016, 4:07 PM

## 2016-12-13 DIAGNOSIS — R0789 Other chest pain: Secondary | ICD-10-CM

## 2016-12-13 DIAGNOSIS — I10 Essential (primary) hypertension: Secondary | ICD-10-CM

## 2016-12-13 DIAGNOSIS — J101 Influenza due to other identified influenza virus with other respiratory manifestations: Secondary | ICD-10-CM | POA: Diagnosis not present

## 2016-12-13 DIAGNOSIS — J111 Influenza due to unidentified influenza virus with other respiratory manifestations: Secondary | ICD-10-CM | POA: Diagnosis not present

## 2016-12-13 LAB — LIPID PANEL
CHOLESTEROL: 165 mg/dL (ref 0–200)
HDL: 38 mg/dL — ABNORMAL LOW (ref >40)
LDL Cholesterol: 115 mg/dL — ABNORMAL HIGH (ref 0–99)
Total CHOL/HDL Ratio: 4.3 RATIO
Triglycerides: 62 mg/dL (ref <150)
VLDL: 12 mg/dL (ref 0–40)

## 2016-12-13 LAB — TROPONIN I: Troponin I: <0.03 ng/mL (ref <0.03)

## 2016-12-13 MED ORDER — OSELTAMIVIR PHOSPHATE 75 MG PO CAPS: 75.0000 mg | ORAL_CAPSULE | Freq: Two times a day (BID) | ORAL | 0 refills | Status: AC

## 2016-12-13 MED ORDER — INFLUENZA VAC SPLIT QUAD 0.5 ML IM SUSY
0.5000 mL | PREFILLED_SYRINGE | Freq: Once | INTRAMUSCULAR | Status: AC
  Administered 2016-12-13: 0.5 mL via INTRAMUSCULAR

## 2016-12-13 MED ORDER — ACETAMINOPHEN 325 MG PO TABS: 650.0000 mg | ORAL_TABLET | ORAL | 0 refills | Status: AC | PRN

## 2016-12-13 NOTE — Discharge Instructions (Signed)

## 2016-12-13 NOTE — Progress Notes (Signed)
Pt has been discharged home with family. IV and telemetry box removed. Pt received discharge instructions and all questions were answered. Pt left with all of her belongings. Pt left the unit via wheelchair and was accompanied by this RN.  Kadejah Sandiford Moffitt BSN, RN 

## 2016-12-13 NOTE — Care Management Obs Status (Signed)
[facility] NOTIFICATION   Patient Details  Name: [name] MRN: [id number] Date of Birth: [birth year]   Medicare Observation Status Notification Given:  Yes    Carles Collet, RN 12/13/2016, 11:13 AM

## 2016-12-13 NOTE — Discharge Summary (Signed)
Physician Discharge Summary  [name] [id number] DOB: 10-24-1945 DOA: 12/12/2016  PCP: Woody Seller, MD  Admit date: 12/12/2016 Discharge date: 12/13/2016  Recommendations for Outpatient Follow-up:  1. Continue Tamiflu for 5 days.  Discharge Diagnoses:  Principal Problem:   Chest pain Active Problems:   Influenza A   HTN (hypertension)   HLD (hyperlipidemia)   Positive D dimer   Thrombocytopenia (HCC)   Pleuritic chest pain   Gastroesophageal reflux disease without esophagitis    Discharge Condition: stable   Diet recommendation: as tolerated   History of present illness:   71 y.o. female with medical history significant for hypertension (patient denies), remote breast cancer. She reported developing cough productiv with orange brown sputum, subjective fevers and subsequent tightness of the right arm and chest onset started the evening prior to the admission. She's had emesis 1. She is also had tightness in the right shoulder blade radiating to the right arm.   In ED, patient was hemodynamically stable. CT angiogram showed no pulmonary embolism. Chest x-ray showed probable bronchitis without evidence of focal pneumonia or CHF. Her influenza test was positive so she was started on Tamiflu.   Hospital Course:   Principal Problem:   Chest pain - Likely related to influenza - Troponin level negative - Echo showed ejection fraction [id number], grade 1 diastolic dysfunction - Chest pain resolved  Active Problems:   Influenza A - started on Tamiflu, will continue for 5 days on discharge    Essential hypertension - Not on any meds - Her BP 135/75 - Outpt follow up    Signed:  Leisa Lenz, MD  [facility] 12/13/2016, 9:30 AM  Pager #: [phone]  Time spent in minutes: less than 30 minutes  Procedures:  ECHO - normal EF, grade 1 DD  Consultations:  None   Discharge Exam: Vitals:   12/13/16 0001 12/13/16 0450  BP: 138/66 (!) 162/75   Pulse: 91 83  Resp: 18 18  Temp: 97.8 F (36.6 C) 99.8 F (37.7 C)   Vitals:   12/12/16 1437 12/12/16 1952 12/13/16 0001 12/13/16 0450  BP: (!) 148/76 (!) 150/76 138/66 (!) 162/75  Pulse: 83 76 91 83  Resp: 17 18 18 18   Temp: 99 F (37.2 C) 97.8 F (36.6 C) 97.8 F (36.6 C) 99.8 F (37.7 C)  TempSrc: Oral Axillary Axillary Oral  SpO2: 98% 97% 98% 99%  Weight: 91.3 kg (201 lb 4.5 oz)     Height: 5\' 7"  (1.702 m)       General: Pt is alert, follows commands appropriately, not in acute distress Cardiovascular: Regular rate and rhythm, S1/S2 +, no murmurs Respiratory: Clear to auscultation bilaterally, no wheezing, no crackles, no rhonchi Abdominal: Soft, non tender, non distended, bowel sounds +, no guarding Extremities: no edema, no cyanosis, pulses palpable bilaterally DP and PT Neuro: Grossly nonfocal  Discharge Instructions  Discharge Instructions    Call MD for:  persistant nausea and vomiting    Complete by:  As directed    Call MD for:  redness, tenderness, or signs of infection (pain, swelling, redness, odor or green/yellow discharge around incision site)    Complete by:  As directed    Call MD for:  severe uncontrolled pain    Complete by:  As directed    Diet - low sodium heart healthy    Complete by:  As directed    Discharge instructions    Complete by:  As directed    Continue tamiflu for 5  days   Increase activity slowly    Complete by:  As directed      Allergies as of 12/13/2016   Not on File     Medication List    TAKE these medications   acetaminophen 325 MG tablet Commonly known as:  TYLENOL Take 2 tablets (650 mg total) by mouth every 4 (four) hours as needed for headache or mild pain.   furosemide 40 MG tablet Commonly known as:  LASIX Take 40 mg by mouth daily as needed for fluid.   oseltamivir 75 MG capsule Commonly known as:  TAMIFLU Take 1 capsule (75 mg total) by mouth 2 (two) times daily.   pseudoephedrine-acetaminophen 30-500  MG Tabs tablet Commonly known as:  TYLENOL SINUS Take 1 tablet by mouth every 4 (four) hours as needed.      Follow-up Information    Woody Seller, MD. Schedule an appointment as soon as possible for a visit in 1 week(s).   Specialty:  Family Medicine Contact information: [address] [place] [address] [phone]            The results of significant diagnostics from this hospitalization (including imaging, microbiology, ancillary and laboratory) are listed below for reference.    Significant Diagnostic Studies: Dg Chest 2 View  Result Date: 12/12/2016 CLINICAL DATA:  Chest pain and chills beginning today EXAM: CHEST  2 VIEW COMPARISON:  CT scan of the chest of May 23, 2010 and chest x-ray of April 16, 2007 FINDINGS: The lungs are adequately inflated. There is no focal infiltrate the interstitial markings are slightly increased overall. There is no pleural effusion. The heart and pulmonary vascularity are normal. The mediastinum is normal in width. There surgical clips in the left axillary region. The bony thorax exhibits no acute abnormality. IMPRESSION: Probable chronic bronchitic changes. There is no pneumonia, CHF, nor other acute cardiopulmonary abnormality. Electronically Signed   By: David  Martinique M.D.   On: 12/12/2016 07:39   Ct Angio Chest Pe W And/or Wo Contrast  Result Date: 12/12/2016 CLINICAL DATA:  Chest pain and cough.  Elevated D-dimer EXAM: CT ANGIOGRAPHY CHEST WITH CONTRAST TECHNIQUE: Multidetector CT imaging of the chest was performed using the standard protocol during bolus administration of intravenous contrast. Multiplanar CT image reconstructions and MIPs were obtained to evaluate the vascular anatomy. CONTRAST:  100 mL Isovue 370 IV COMPARISON:  Chest x-ray 12/12/2016 FINDINGS: Cardiovascular: Negative for pulmonary embolism. Pulmonary arteries normal in caliber. Mild atherosclerotic disease in the aortic arch without aneurysm or dissection.  Coronary artery calcification. Heart size upper normal. Negative for pericardial effusion. Mediastinum/Nodes: Negative Lungs/Pleura: Pleuroparenchymal scarring in the left upper lobe anteriorly unchanged from CT of 05/23/2010. Negative for pneumonia or effusion. Negative for mass lesion. Mild ground-glass density in the lung bases bilaterally likely atelectasis. Upper Abdomen: Negative Musculoskeletal: Negative Review of the MIP images confirms the above findings. IMPRESSION: Negative for pulmonary embolism.  No acute abnormality in the chest. Left upper lobe scarring is stable. Electronically Signed   By: Franchot Gallo M.D.   On: 12/12/2016 11:05    Microbiology: No results found for this or any previous visit (from the past 240 hour(s)).   Labs: Basic Metabolic Panel:  Recent Labs Lab 12/12/16 0743  NA 137  K 3.9  CL 102  CO2 26  GLUCOSE 119*  BUN 7  CREATININE 0.78  CALCIUM 8.7*   Liver Function Tests:  Recent Labs Lab 12/12/16 0817  AST 22  ALT 14  ALKPHOS  87  BILITOT 0.5  PROT 7.1  ALBUMIN 3.6    Recent Labs Lab 12/12/16 0817  LIPASE 20   No results for input(s): AMMONIA in the last 168 hours. CBC:  Recent Labs Lab 12/12/16 0743  WBC 5.7  HGB 11.8*  HCT 36.1  MCV 85.7  PLT 146*   Cardiac Enzymes:  Recent Labs Lab 12/12/16 1120 12/12/16 1850 12/13/16 0052  TROPONINI <0.03 <0.03 <0.03   BNP: BNP (last 3 results) No results for input(s): BNP in the last 8760 hours.  ProBNP (last 3 results) No results for input(s): PROBNP in the last 8760 hours.  CBG: No results for input(s): GLUCAP in the last 168 hours.

## 2017-03-11 ENCOUNTER — Emergency Department (HOSPITAL_COMMUNITY)
Admission: EM | Admit: 2017-03-11 | Discharge: 2017-03-11 | Disposition: A | Discharge status: Home / Self Care | Payer: Medicare Other | Attending: Emergency Medicine | Admitting: Emergency Medicine

## 2017-03-11 ENCOUNTER — Emergency Department (HOSPITAL_COMMUNITY): Payer: Medicare Other

## 2017-03-11 ENCOUNTER — Encounter (HOSPITAL_COMMUNITY): Payer: Self-pay | Admitting: Emergency Medicine

## 2017-03-11 DIAGNOSIS — W010XXA Fall on same level from slipping, tripping and stumbling without subsequent striking against object, initial encounter: Secondary | ICD-10-CM | POA: Insufficient documentation

## 2017-03-11 DIAGNOSIS — Y9389 Activity, other specified: Secondary | ICD-10-CM | POA: Insufficient documentation

## 2017-03-11 DIAGNOSIS — W19XXXA Unspecified fall, initial encounter: Secondary | ICD-10-CM

## 2017-03-11 DIAGNOSIS — Y999 Unspecified external cause status: Secondary | ICD-10-CM | POA: Insufficient documentation

## 2017-03-11 DIAGNOSIS — S6991XA Unspecified injury of right wrist, hand and finger(s), initial encounter: Secondary | ICD-10-CM | POA: Diagnosis present

## 2017-03-11 DIAGNOSIS — S52571A Other intraarticular fracture of lower end of right radius, initial encounter for closed fracture: Secondary | ICD-10-CM

## 2017-03-11 DIAGNOSIS — Y9289 Other specified places as the place of occurrence of the external cause: Secondary | ICD-10-CM | POA: Insufficient documentation

## 2017-03-11 DIAGNOSIS — S52501A Unspecified fracture of the lower end of right radius, initial encounter for closed fracture: Secondary | ICD-10-CM

## 2017-03-11 HISTORY — PX: WRIST FRACTURE SURGERY: SHX121

## 2017-03-11 LAB — CBC WITH DIFFERENTIAL/PLATELET
BASOS ABS: 0 10*3/uL (ref 0.0–0.1)
Basophils Relative: 0 %
EOS ABS: 0 10*3/uL (ref 0.0–0.7)
EOS PCT: 1 %
HCT: 38.1 % (ref 36.0–46.0)
Hemoglobin: 12.2 g/dL (ref 12.0–15.0)
LYMPHS ABS: 2 10*3/uL (ref 0.7–4.0)
Lymphocytes Relative: 38 %
MCH: 28 pg (ref 26.0–34.0)
MCHC: 32 g/dL (ref 30.0–36.0)
MCV: 87.6 fL (ref 78.0–100.0)
MONO ABS: 0.3 10*3/uL (ref 0.1–1.0)
Monocytes Relative: 5 %
Neutro Abs: 3 10*3/uL (ref 1.7–7.7)
Neutrophils Relative %: 56 %
PLATELETS: 168 10*3/uL (ref 150–400)
RBC: 4.35 MIL/uL (ref 3.87–5.11)
RDW: 14.9 % (ref 11.5–15.5)
WBC: 5.3 10*3/uL (ref 4.0–10.5)

## 2017-03-11 LAB — BASIC METABOLIC PANEL
ANION GAP: 7 (ref 5–15)
BUN: 11 mg/dL (ref 6–20)
CO2: 25 mmol/L (ref 22–32)
CREATININE: 0.83 mg/dL (ref 0.44–1.00)
Calcium: 8.7 mg/dL — ABNORMAL LOW (ref 8.9–10.3)
Chloride: 107 mmol/L (ref 101–111)
GLUCOSE: 98 mg/dL (ref 65–99)
POTASSIUM: 3.9 mmol/L (ref 3.5–5.1)
SODIUM: 139 mmol/L (ref 135–145)

## 2017-03-11 MED ORDER — OXYCODONE-ACETAMINOPHEN 5-325 MG PO TABS: 1.0000 | ORAL_TABLET | Freq: Four times a day (QID) | ORAL | 0 refills | Status: DC | PRN

## 2017-03-11 NOTE — Progress Notes (Signed)
Orthopedic Tech Progress Note Patient Details:  [name] 12-Jun-1946 [id number]  Ortho Devices Type of Ortho Device: Ace wrap, Arm sling, Sugartong splint Ortho Device/Splint Location: RUE Ortho Device/Splint Interventions: Ordered, Application   Braulio Bosch 03/11/2017, 4:07 PM

## 2017-03-11 NOTE — ED Triage Notes (Signed)
Pt sts trip and fall today with right arm pain; CMS intact

## 2017-03-11 NOTE — ED Notes (Signed)
Ortho tech notified for splint and sling placement.

## 2017-03-11 NOTE — ED Notes (Signed)
Patient standing at nurse station requesting I phone charger.  Informed patient we don't have one in this station.  Patient waiting for another patient to finish using her [employer] consultant.

## 2017-03-11 NOTE — ED Provider Notes (Signed)
[facility] DEPT Provider Note   CSN: [id number] Arrival date & time: 03/11/17  1122  By signing my name below, I, Sonum Patel, attest that this documentation has been prepared under the direction and in the presence of Davonna Belling, MD. Electronically Signed: Ludger Nutting, Scribe. 03/11/17. 12:35 PM.  History   Chief Complaint Chief Complaint  Patient presents with  . Fall  . Arm Pain    The history is provided by the patient. No language interpreter was used.     HPI Comments: [name] is a 71 y.o. female who presents to the Emergency Department complaining of a fall that occurred earlier today. She states it was a mechanical fall and she landed on her right elbow. She denies head injury or LOC. She currently complain of constant, unchanged right forearm and wrist pain that is worse with movement of the RUE. She does not take regular anti-coagulants. She denies taking any other medications regularly. She is right hand dominant.   Past Medical History:  Diagnosis Date  . Cancer [facility])     Patient Active Problem List   Diagnosis Date Noted  . Chest pain 12/12/2016  . Influenza A 12/12/2016  . HTN (hypertension) 12/12/2016  . HLD (hyperlipidemia) 12/12/2016  . Positive D dimer 12/12/2016  . Thrombocytopenia ([place]) 12/12/2016  . Pleuritic chest pain   . Gastroesophageal reflux disease without esophagitis     Past Surgical History:  Procedure Laterality Date  . ABDOMINAL HYSTERECTOMY    . BREAST SURGERY    . CHOLECYSTECTOMY    . TONSILLECTOMY      OB History    No data available       Home Medications    Prior to Admission medications   Medication Sig Start Date End Date Taking? Authorizing Provider  acetaminophen (TYLENOL) 325 MG tablet Take 2 tablets (650 mg total) by mouth every 4 (four) hours as needed for headache or mild pain. 12/13/16   Robbie Lis, MD  furosemide (LASIX) 40 MG tablet Take 40 mg by mouth daily as needed for fluid.     [provider]  oxyCODONE-acetaminophen (PERCOCET/ROXICET) 5-325 MG tablet Take 1-2 tablets by mouth every 6 (six) hours as needed. 03/11/17   Davonna Belling, MD  pseudoephedrine-acetaminophen (TYLENOL SINUS) 30-500 MG TABS tablet Take 1 tablet by mouth every 4 (four) hours as needed.    [provider]    Family History History reviewed. No pertinent family history.  Social History Social History  Substance Use Topics  . Smoking status: Never Smoker  . Smokeless tobacco: Never Used  . Alcohol use No     Allergies   Patient has no known allergies.   Review of Systems Review of Systems  Musculoskeletal: Positive for arthralgias and joint swelling.  Neurological: Negative for syncope.  All other systems reviewed and are negative.    Physical Exam Updated Vital Signs BP (!) 179/80 (BP Location: Right Arm)   Pulse 74   Temp 97.8 F (36.6 C) (Oral)   Resp 16   SpO2 99%   Physical Exam  Constitutional: She is oriented to person, place, and time. She appears well-developed and well-nourished.  HENT:  Head: Normocephalic and atraumatic.  Eyes: EOM are normal.  Neck: Normal range of motion. No tracheal deviation present.  Cardiovascular: Normal rate and intact distal pulses.   Pulmonary/Chest: Effort normal. No respiratory distress.  Abdominal: She exhibits no distension.  Musculoskeletal: She exhibits tenderness.  RUE: No tenderness to shoulder or elbow.  Good ROM of the elbow. Tenderness distally over forearm. Distal radial pulses intact bilaterally. NVI in hand. Strong radial pulse.   Neurological: She is alert and oriented to person, place, and time. No sensory deficit.  Skin: Skin is warm and dry.  Psychiatric: She has a normal mood and affect. Her behavior is normal.  Nursing note and vitals reviewed.    ED Treatments / Results    COORDINATION OF CARE: 12:10 PM Discussed treatment plan with pt at bedside and pt agreed to plan.    Labs (all labs ordered are listed, but only abnormal results are displayed) Labs Reviewed  BASIC METABOLIC PANEL - Abnormal; Notable for the following:       Result Value   Calcium 8.7 (*)    All other components within normal limits  CBC WITH DIFFERENTIAL/PLATELET    EKG  EKG Interpretation None       Radiology Dg Elbow Complete Right  Result Date: 03/11/2017 CLINICAL DATA:  Fall this morning.  Right wrist and elbow pain. EXAM: RIGHT ELBOW - COMPLETE 3+ VIEW COMPARISON:  None. FINDINGS: There is no evidence of fracture, dislocation, or joint effusion. There is no evidence of arthropathy or other focal bone abnormality. Soft tissues are unremarkable. IMPRESSION: Negative. Electronically Signed   By: Rolm Baptise M.D.   On: 03/11/2017 13:10   Dg Wrist Complete Right  Result Date: 03/11/2017 CLINICAL DATA:  Fall today.  Right wrist pain.  Initial encounter. EXAM: RIGHT WRIST - COMPLETE 3+ VIEW COMPARISON:  None. FINDINGS: There is a mildly comminuted, impacted fracture of the distal radius which demonstrates dorsal angulation and mild dorsal displacement. The distal ulna appears intact. There is no dislocation. Soft tissue swelling is present diffusely about the wrist. IMPRESSION: Distal right radius fracture as above. Electronically Signed   By: Logan Bores M.D.   On: 03/11/2017 13:11    Procedures Procedures (including critical care time)  Medications Ordered in ED Medications - No data to display   Initial Impression / Assessment and Plan / ED Course  I have reviewed the triage vital signs and the nursing notes.  Pertinent labs & imaging results that were available during my care of the patient were reviewed by me and considered in my medical decision making (see chart for details).     Patient with fall. Distal radius fracture. Last food was at around 11 AM. Discussed with Dr. Lenon Curt who initially wanted to the operating room but the OR would not go without 8 hours of  nothing by mouth status. With this time he will not do it today. States his office will contact her either today or tomorrow and will be done in the next few days. Discharge home.  Final Clinical Impressions(s) / ED Diagnoses   Final diagnoses:  Closed fracture of distal end of right radius, unspecified fracture morphology, initial encounter  Fall, initial encounter  Other closed intra-articular fracture of distal end of right radius, initial encounter    New Prescriptions New Prescriptions   OXYCODONE-ACETAMINOPHEN (PERCOCET/ROXICET) 5-325 MG TABLET    Take 1-2 tablets by mouth every 6 (six) hours as needed.   I personally performed the services described in this documentation, which was scribed in my presence. The recorded information has been reviewed and is accurate.      Davonna Belling, MD 03/11/17 [phone]

## 2017-04-16 ENCOUNTER — Other Ambulatory Visit: Payer: Self-pay | Admitting: Obstetrics and Gynecology

## 2017-04-16 DIAGNOSIS — Z1231 Encounter for screening mammogram for malignant neoplasm of breast: Secondary | ICD-10-CM

## 2017-04-16 DIAGNOSIS — E2839 Other primary ovarian failure: Secondary | ICD-10-CM

## 2017-04-23 ENCOUNTER — Ambulatory Visit: Payer: BC Managed Care – PPO

## 2017-04-23 ENCOUNTER — Other Ambulatory Visit: Payer: BC Managed Care – PPO

## 2017-05-01 ENCOUNTER — Ambulatory Visit
Admission: RE | Admit: 2017-05-01 | Discharge: 2017-05-01 | Disposition: A | Discharge status: Home / Self Care | Payer: Medicare Other | Source: Ambulatory Visit | Attending: Obstetrics and Gynecology | Admitting: Obstetrics and Gynecology

## 2017-05-01 DIAGNOSIS — E2839 Other primary ovarian failure: Secondary | ICD-10-CM

## 2017-05-01 DIAGNOSIS — Z1231 Encounter for screening mammogram for malignant neoplasm of breast: Secondary | ICD-10-CM

## 2017-09-16 ENCOUNTER — Emergency Department (HOSPITAL_COMMUNITY)
Admission: EM | Admit: 2017-09-16 | Discharge: 2017-09-16 | Disposition: A | Discharge status: Home / Self Care | Payer: Medicare Other | Attending: Emergency Medicine | Admitting: Emergency Medicine

## 2017-09-16 ENCOUNTER — Emergency Department (HOSPITAL_COMMUNITY): Payer: Medicare Other

## 2017-09-16 ENCOUNTER — Encounter (HOSPITAL_COMMUNITY): Payer: Self-pay | Admitting: Emergency Medicine

## 2017-09-16 DIAGNOSIS — W0110XA Fall on same level from slipping, tripping and stumbling with subsequent striking against unspecified object, initial encounter: Secondary | ICD-10-CM | POA: Insufficient documentation

## 2017-09-16 DIAGNOSIS — Y9301 Activity, walking, marching and hiking: Secondary | ICD-10-CM | POA: Diagnosis not present

## 2017-09-16 DIAGNOSIS — S82031A Displaced transverse fracture of right patella, initial encounter for closed fracture: Secondary | ICD-10-CM | POA: Insufficient documentation

## 2017-09-16 DIAGNOSIS — Y929 Unspecified place or not applicable: Secondary | ICD-10-CM | POA: Insufficient documentation

## 2017-09-16 DIAGNOSIS — S8991XA Unspecified injury of right lower leg, initial encounter: Secondary | ICD-10-CM | POA: Diagnosis present

## 2017-09-16 DIAGNOSIS — Y999 Unspecified external cause status: Secondary | ICD-10-CM | POA: Diagnosis not present

## 2017-09-16 DIAGNOSIS — I10 Essential (primary) hypertension: Secondary | ICD-10-CM | POA: Insufficient documentation

## 2017-09-16 MED ORDER — OXYCODONE-ACETAMINOPHEN 5-325 MG PO TABS: 1.0000 | ORAL_TABLET | Freq: Four times a day (QID) | ORAL | 0 refills | Status: DC | PRN

## 2017-09-16 NOTE — ED Triage Notes (Signed)
Patient c/o swelling and pain to right knee after fall x9 days ago. Ambulatory. Patient hypertensive in triage. Denies taking BP medications.

## 2017-09-16 NOTE — Discharge Instructions (Signed)
You were seen in the ED today with knee pain after a fall. Your knee cap bone has a break. You will need to keep the knee in a splint and call the orthopedic surgeon tomorrow to schedule an appointment. Use the Percocet only as needed for severe pain. Do not drive a car while taking this medication. Take a stool softener or laxative if using the Percocet because it can cause constipation.   Return to the ED with any worsening pain or new injury.

## 2017-09-16 NOTE — ED Provider Notes (Signed)
Emergency Department Provider Note   I have reviewed the triage vital signs and the nursing notes.   HISTORY  Chief Complaint Knee Pain   HPI [name] is a 71 y.o. female with PMH of HLD, HTN, and GERD evaluation right knee pain after fall 9 days ago.  Patient tripped and fell with no head trauma.  Since that time she is been ambulatory on the knee but has had pain over the kneecap.  She noted some swelling with warmth that improved slightly.  She has been taking Tylenol with little to no relief in pain symptoms.  She denies any pain in other locations.  No numbness or tingling.    Past Medical History:  Diagnosis Date  . Cancer [facility])     Patient Active Problem List   Diagnosis Date Noted  . Chest pain 12/12/2016  . Influenza A 12/12/2016  . HTN (hypertension) 12/12/2016  . HLD (hyperlipidemia) 12/12/2016  . Positive D dimer 12/12/2016  . Thrombocytopenia ([place]) 12/12/2016  . Pleuritic chest pain   . Gastroesophageal reflux disease without esophagitis     Past Surgical History:  Procedure Laterality Date  . ABDOMINAL HYSTERECTOMY    . BREAST LUMPECTOMY Left 1997   radiation and chemo  . BREAST SURGERY    . CHOLECYSTECTOMY    . TONSILLECTOMY      Current Outpatient Rx  . Order #: [id number] Class: Print  . Order #: [id number] Class: Historical Med  . Order #: [id number] Class: Print  . Order #: [id number] Class: Historical Med    Allergies Patient has no known allergies.  No family history on file.  Social History Social History   Tobacco Use  . Smoking status: Never Smoker  . Smokeless tobacco: Never Used  Substance Use Topics  . Alcohol use: No  . Drug use: No    Review of Systems  Constitutional: No fever/chills Eyes: No visual changes. ENT: No sore throat. Cardiovascular: Denies chest pain. Respiratory: Denies shortness of breath. Gastrointestinal: No abdominal pain.  No nausea, no vomiting.  No diarrhea.  No constipation. Genitourinary:  Negative for dysuria. Musculoskeletal: Negative for back pain. Right knee pain and swelling.  Skin: Negative for rash. Neurological: Negative for headaches, focal weakness or numbness.  10-point ROS otherwise negative.  ____________________________________________   PHYSICAL EXAM:  VITAL SIGNS: ED Triage Vitals [09/16/17 1841]  Enc Vitals Group     BP (!) 182/100     Pulse Rate 78     Resp 18     Temp 97.8 F (36.6 C)     Temp Source Oral     SpO2 100 %     Pain Score 8   Constitutional: Alert and oriented. Well appearing and in no acute distress. Eyes: Conjunctivae are normal.  Head: Atraumatic. Nose: No congestion/rhinnorhea. Mouth/Throat: Mucous membranes are moist.  Neck: No stridor.  Cardiovascular: Normal rate, regular rhythm. Good peripheral circulation. Grossly normal heart sounds.   Respiratory: Normal respiratory effort. No retractions. Lungs CTAB. Gastrointestinal: Soft and nontender. No distention.  Musculoskeletal: Tenderness over the right patella with mild swelling. No erythema or warmth. No medial or lateral knee tenderness. Normal active ROM of the bilateral hips and ankles.  Neurologic:  Normal speech and language. No gross focal neurologic deficits are appreciated.  Skin:  Skin is warm, dry and intact. No rash noted.  ____________________________________________  RADIOLOGY  Dg Knee Complete 4 Views Right  Result Date: 09/16/2017 CLINICAL DATA:  71 y/o F; fall 9 days ago with pain and  swelling anteriorly. EXAM: RIGHT KNEE - COMPLETE 4+ VIEW COMPARISON:  None. FINDINGS: Acute minimally displaced trans mid patellar fracture. Joint spaces are well maintained. Mild spurring of tibial spines. Mineralization in the lateral femorotibial compartment compatible with chondrocalcinosis. Soft tissue swelling anterior to the patella. IMPRESSION: Acute minimally displaced transverse mid patellar fracture. No other fracture or dislocation identified. Lateral  femorotibial compartment chondrocalcinosis. Electronically Signed   By: Kristine Garbe M.D.   On: 09/16/2017 19:27    ____________________________________________   PROCEDURES  Procedure(s) performed:   .Splint Application Date/Time: 81/19/1478 11:38 AM Performed by: Margette Fast, MD Authorized by: Margette Fast, MD   Consent:    Consent obtained:  Verbal   Consent given by:  Patient   Risks discussed:  Discoloration, numbness, pain and swelling   Alternatives discussed:  Alternative treatment Pre-procedure details:    Sensation:  Normal Procedure details:    Laterality:  Right   Location:  Leg   Splint type:  Knee immobilizer Post-procedure details:    Pain:  Unchanged   Sensation:  Normal   Patient tolerance of procedure:  Tolerated well, no immediate complications   ____________________________________________   INITIAL IMPRESSION / ASSESSMENT AND PLAN / ED COURSE  Pertinent labs & imaging results that were available during my care of the patient were reviewed by me and considered in my medical decision making (see chart for details).  Patient presents to the emergency department for evaluation of right knee pain after fall 9 days ago.  Compartments are soft.  Mild swelling around the right knee with no normal range of motion of the hip and ankle.  No tenderness in these locations.  Plan for knee immobilization and outpatient follow-up with orthopedics.   At this time, I do not feel there is any life-threatening condition present. I have reviewed and discussed all results (EKG, imaging, lab, urine as appropriate), exam findings with patient. I have reviewed nursing notes and appropriate previous records.  I feel the patient is safe to be discharged home without further emergent workup. Discussed usual and customary return precautions. Patient and family (if present) verbalize understanding and are comfortable with this plan.  Patient will follow-up with their  primary care provider. If they do not have a primary care provider, information for follow-up has been provided to them. All questions have been answered.  ____________________________________________  FINAL CLINICAL IMPRESSION(S) / ED DIAGNOSES  Final diagnoses:  Closed displaced transverse fracture of right patella, initial encounter     MEDICATIONS GIVEN DURING THIS VISIT:  None  NEW OUTPATIENT MEDICATIONS STARTED DURING THIS VISIT:  Percocet   Note:  This document was prepared using Dragon voice recognition software and may include unintentional dictation errors.  Nanda Quinton, MD Emergency Medicine    Long, [facility], MD 09/17/17 [phone]

## 2017-10-09 ENCOUNTER — Encounter (INDEPENDENT_AMBULATORY_CARE_PROVIDER_SITE_OTHER): Payer: Self-pay | Admitting: Orthopedic Surgery

## 2017-10-09 ENCOUNTER — Ambulatory Visit (INDEPENDENT_AMBULATORY_CARE_PROVIDER_SITE_OTHER): Payer: Medicare Other

## 2017-10-09 ENCOUNTER — Ambulatory Visit (INDEPENDENT_AMBULATORY_CARE_PROVIDER_SITE_OTHER): Payer: Medicare Other | Admitting: Orthopedic Surgery

## 2017-10-09 DIAGNOSIS — S82001A Unspecified fracture of right patella, initial encounter for closed fracture: Secondary | ICD-10-CM | POA: Diagnosis not present

## 2017-10-12 ENCOUNTER — Encounter (INDEPENDENT_AMBULATORY_CARE_PROVIDER_SITE_OTHER): Payer: Self-pay | Admitting: Orthopedic Surgery

## 2017-10-12 NOTE — Progress Notes (Signed)
Office Visit Note   Patient: [name]           Date of Birth: 24-May-1946           MRN: [id number] Visit Date: 10/09/2017 Requested by: Christain Sacramento, MD [address] [place], [place] [address] PCP: Christain Sacramento, MD  Subjective: Chief Complaint  Patient presents with  . Right Knee - Pain    HPI: [name] is a patient with right knee injury sustained 09/07/2017.  He states "I have a broken kneecap".  She has been ambulating full weightbearing in a knee immobilizer.  The pain comes and goes depending on activity.  She describes swelling.  She states she has been elevating the knee.              ROS: All systems reviewed are negative as they relate to the chief complaint within the history of present illness.  Patient denies  fevers or chills.   Assessment & Plan: Visit Diagnoses:  1. Unspecified fracture of right patella, initial encounter for closed fracture     Plan: Impression is nondisplaced right patella fracture.  Plan is to continue with brace immobilization and ambulation.  He can do a straight leg raise at this time.  Radiographs show no displacement of the patella fracture.  Only mild tenderness is present.  The plan on 3-week return with repeat radiographs and possible release at that time if she is functional and there is been no displacement of the fracture  Follow-Up Instructions: Return in about 3 weeks (around 10/30/2017).   Orders:  Orders Placed This Encounter  Procedures  . XR KNEE 3 VIEW RIGHT   No orders of the defined types were placed in this encounter.     Procedures: No procedures performed   Clinical Data: No additional findings.  Objective: Vital Signs: There were no vitals taken for this visit.  Physical Exam:   Constitutional: Patient appears well-developed HEENT:  Head: Normocephalic Eyes:EOM are normal Neck: Normal range of motion Cardiovascular: Normal rate Pulmonary/chest: Effort normal Neurologic: Patient is  alert Skin: Skin is warm Psychiatric: Patient has normal mood and affect    Ortho Exam: Orthopedic exam demonstrates normal alignment with the leg straight.  Patient can do a straight leg raise with the right knee with no extensor lag.  There is no effusion.  Collateral and cruciate ligaments are stable.  Only mild tenderness around the patella itself with no palpable defect or significant swelling.  Specialty Comments:  No specialty comments available.  Imaging: No results found.   PMFS History: Patient Active Problem List   Diagnosis Date Noted  . Chest pain 12/12/2016  . Influenza A 12/12/2016  . HTN (hypertension) 12/12/2016  . HLD (hyperlipidemia) 12/12/2016  . Positive D dimer 12/12/2016  . Thrombocytopenia ([place]) 12/12/2016  . Pleuritic chest pain   . Gastroesophageal reflux disease without esophagitis    Past Medical History:  Diagnosis Date  . Cancer [facility])     History reviewed. No pertinent family history.  Past Surgical History:  Procedure Laterality Date  . ABDOMINAL HYSTERECTOMY    . BREAST LUMPECTOMY Left 1997   radiation and chemo  . BREAST SURGERY    . CHOLECYSTECTOMY    . TONSILLECTOMY     Social History   Occupational History  . Not on file  Tobacco Use  . Smoking status: Never Smoker  . Smokeless tobacco: Never Used  Substance and Sexual Activity  . Alcohol use: No  .  Drug use: No  . Sexual activity: Not on file       

## 2017-10-22 DIAGNOSIS — C50919 Malignant neoplasm of unspecified site of unspecified female breast: Secondary | ICD-10-CM

## 2017-10-22 HISTORY — DX: Malignant neoplasm of unspecified site of unspecified female breast: C50.919

## 2017-11-01 ENCOUNTER — Ambulatory Visit (INDEPENDENT_AMBULATORY_CARE_PROVIDER_SITE_OTHER): Payer: Medicare Other

## 2017-11-01 ENCOUNTER — Encounter (INDEPENDENT_AMBULATORY_CARE_PROVIDER_SITE_OTHER): Payer: Self-pay | Admitting: Orthopedic Surgery

## 2017-11-01 ENCOUNTER — Ambulatory Visit (INDEPENDENT_AMBULATORY_CARE_PROVIDER_SITE_OTHER): Payer: Medicare Other | Admitting: Orthopedic Surgery

## 2017-11-01 DIAGNOSIS — M25561 Pain in right knee: Secondary | ICD-10-CM

## 2017-11-01 DIAGNOSIS — M19041 Primary osteoarthritis, right hand: Secondary | ICD-10-CM

## 2017-11-01 NOTE — Progress Notes (Signed)
   Post-Op Visit Note   Patient: CHARNAE LILL           Date of Birth: 1946-03-24           MRN: [id number] Visit Date: 11/01/2017 PCP: Christain Sacramento, MD   Assessment & Plan:  Chief Complaint:  Chief Complaint  Patient presents with  . Right Knee - Follow-up, Fracture   Visit Diagnoses:  1. Right knee pain, unspecified chronicity     Plan: Vee is a 72 year old patient who is now 2 months out right patella fracture transverse nondisplaced.  She is been walking with a brace.  Having no issues.  On exam she has full extension against gravity no effusion and good range of motion.  Radiographs show no change in fracture alignment.  Plan is activity as tolerated but avoid running and jumping for at least another month.  She can use that knee sleeve for another month as she feels like she needs it.  I will see her back as needed  Follow-Up Instructions: Return if symptoms worsen or fail to improve.   Orders:  Orders Placed This Encounter  Procedures  . XR KNEE 3 VIEW RIGHT   No orders of the defined types were placed in this encounter.   Imaging: Xr Knee 3 View Right  Result Date: 11/01/2017 AP lateral merchant right knee reviewed.  Patella fracture noted with no displacement.  Some interval healing has occurred.  Remainder of the knee is normal with minimal degenerative changes.   PMFS History: Patient Active Problem List   Diagnosis Date Noted  . Chest pain 12/12/2016  . Influenza A 12/12/2016  . HTN (hypertension) 12/12/2016  . HLD (hyperlipidemia) 12/12/2016  . Positive D dimer 12/12/2016  . Thrombocytopenia ([place]) 12/12/2016  . Pleuritic chest pain   . Gastroesophageal reflux disease without esophagitis    Past Medical History:  Diagnosis Date  . Cancer [facility])     History reviewed. No pertinent family history.  Past Surgical History:  Procedure Laterality Date  . ABDOMINAL HYSTERECTOMY    . BREAST LUMPECTOMY Left 1997   radiation and chemo  . BREAST  SURGERY    . CHOLECYSTECTOMY    . TONSILLECTOMY     Social History   Occupational History  . Not on file  Tobacco Use  . Smoking status: Never Smoker  . Smokeless tobacco: Never Used  Substance and Sexual Activity  . Alcohol use: No  . Drug use: No  . Sexual activity: Not on file

## 2017-11-01 NOTE — Addendum Note (Signed)
Addended byLaurann Montana on: 11/01/2017 09:58 AM   Modules accepted: Orders

## 2018-01-01 NOTE — Progress Notes (Signed)
Office Visit Note  Patient: [name]             Date of Birth: 1946/03/04           MRN: [id number]             PCP: Christain Sacramento, MD Referring: Christain Sacramento, MD Visit Date: 01/14/2018 Occupation:  Retired -Text book co-ordinator    Subjective:  Pain and arthritis in hands   History of Present Illness: [name] is a 72 y.o. female seen in consultation per request of Dr. Marlou Sa.  According to patient in May 2018 she fell and fractured her right wrist.  She had to undergo surgery with plate and screw placement.  She stated after that she started going to physical therapy.  She never could completely close her right hand.  She has noticed arthritis in her bilateral hands which is more prominent in her right hand.  She continues to have discomfort in her hands.  In November 2018 she fell and fractured her right patella.  She was evaluated by Dr. Marlou Sa.  She states she still have some discomfort in her right knee.  She has been having some discomfort in her left knee as well.  She she feels unstable on her feet.  She states she has had 2 falls due to losing balance.  None of the other joints are painful.  Activities of Daily Living:  Patient reports morning stiffness for 4-5 hours.   Patient Denies nocturnal pain.  Difficulty dressing/grooming: Denies Difficulty climbing stairs: Reports Difficulty getting out of chair: Reports Difficulty using hands for taps, buttons, cutlery, and/or writing: Reports   Review of Systems  Constitutional: Negative for fatigue.  HENT: Negative for mouth sores, trouble swallowing, trouble swallowing, mouth dryness and nose dryness.   Eyes: Negative for pain, redness, visual disturbance and dryness.  Respiratory: Negative for cough, hemoptysis, shortness of breath and difficulty breathing.   Cardiovascular: Negative for chest pain, palpitations, hypertension and swelling in legs/feet.  Gastrointestinal: Negative for blood in stool,  constipation and diarrhea.  Endocrine: Negative for increased urination.  Genitourinary: Negative for painful urination.  Musculoskeletal: Positive for arthralgias, joint pain, joint swelling and morning stiffness. Negative for myalgias, muscle weakness, muscle tenderness and myalgias.  Skin: Negative for color change, pallor, rash, hair loss, nodules/bumps, skin tightness, ulcers and sensitivity to sunlight.  Allergic/Immunologic: Negative for susceptible to infections.  Neurological: Negative for dizziness, numbness, headaches and weakness.  Hematological: Negative for swollen glands.  Psychiatric/Behavioral: Positive for sleep disturbance. Negative for depressed mood. The patient is not nervous/anxious.     PMFS History:  Patient Active Problem List   Diagnosis Date Noted  . History of breast cancer 01/14/2018  . Chest pain 12/12/2016  . Influenza A 12/12/2016  . HTN (hypertension) 12/12/2016  . HLD (hyperlipidemia) 12/12/2016  . Positive D dimer 12/12/2016  . Thrombocytopenia ([place]) 12/12/2016  . Pleuritic chest pain   . Gastroesophageal reflux disease without esophagitis     Past Medical History:  Diagnosis Date  . Cancer [facility])     Family History  Problem Relation Age of Onset  . COPD Mother   . Rectal cancer Father   . Hypertension Sister   . Sarcoidosis Sister   . High Cholesterol Sister   . Hypertension Brother   . High Cholesterol Brother   . Heart attack Brother   . Stroke Brother   . Hypertension Sister   . High Cholesterol Sister   .  Asthma Daughter    Past Surgical History:  Procedure Laterality Date  . ABDOMINAL HYSTERECTOMY    . BREAST LUMPECTOMY Left 1997   radiation and chemo  . BREAST SURGERY    . CHOLECYSTECTOMY    . TONSILLECTOMY     Social History   Social History Narrative  . Not on file     Objective: Vital Signs: BP (!) 147/90 (BP Location: Right Arm, Patient Position: Sitting, Cuff Size: Normal)   Pulse 73   Resp 15   Ht 5\' 6"   (1.676 m)   Wt 199 lb (90.3 kg)   BMI 32.12 kg/m    Physical Exam  Constitutional: She is oriented to person, place, and time. She appears well-developed and well-nourished.  HENT:  Head: Normocephalic and atraumatic.  Eyes: Conjunctivae and EOM are normal.  Neck: Normal range of motion.  Cardiovascular: Normal rate, regular rhythm, normal heart sounds and intact distal pulses.  Pulmonary/Chest: Effort normal and breath sounds normal.  Abdominal: Soft. Bowel sounds are normal.  Musculoskeletal:  Left arm lymphedema  Lymphadenopathy:    She has no cervical adenopathy.  Neurological: She is alert and oriented to person, place, and time.  Skin: Skin is warm and dry. Capillary refill takes less than 2 seconds.  Psychiatric: She has a normal mood and affect. Her behavior is normal.  Nursing note and vitals reviewed.    Musculoskeletal Exam: C-spine thoracic lumbar spine good range of motion.  She had good range of motion of her shoulder joints.  She has limited extension of her left elbow it was in a sleeve.  She has good range of motion of her left wrist joint , right wrist joint and limited range of motion.  No synovitis was noted over her wrist joints.  She has DIP PIP thickening in her bilateral hands with no synovitis.  Hip joints, knee joints, ankles and MTPs with good range of motion with no synovitis.  She has some crepitus and discomfort range of motion of her left knee joint without any warmth swelling or effusion.  CDAI Exam: No CDAI exam completed.    Investigation: No additional findings. CBC Latest Ref Rng & Units 03/11/2017 12/12/2016 04/16/2007  WBC 4.0 - 10.5 K/uL 5.3 5.7 6.3  Hemoglobin 12.0 - 15.0 g/dL 12.2 11.8(L) 13.9  Hematocrit 36.0 - 46.0 % 38.1 36.1 42.0  Platelets 150 - 400 K/uL 168 146(L) 187   CMP Latest Ref Rng & Units 03/11/2017 12/12/2016 04/16/2007  Glucose 65 - 99 mg/dL 98 119(H) 106(H)  BUN 6 - 20 mg/dL 11 7 6   Creatinine 0.44 - 1.00 mg/dL 0.83 0.78  0.56  Sodium 135 - 145 mmol/L 139 137 135  Potassium 3.5 - 5.1 mmol/L 3.9 3.9 3.6  Chloride 101 - 111 mmol/L 107 102 102  CO2 22 - 32 mmol/L 25 26 24   Calcium 8.9 - 10.3 mg/dL 8.7(L) 8.7(L) 9.2  Total Protein 6.5 - 8.1 g/dL - 7.1 7.6  Total Bilirubin 0.3 - 1.2 mg/dL - 0.5 0.8  Alkaline Phos 38 - 126 U/L - 87 101  AST 15 - 41 U/L - 22 20  ALT 14 - 54 U/L - 14 19    Imaging: Xr Hand 2 View Left  Result Date: 01/14/2018 PIP, DIP, CMC narrowing was noted.  No MCP, metacarpocarpal or intercarpal or radiocarpal joint space narrowing was noted.  No juxta-articular osteopenia was noted.  No erosive changes were noted. Impression: These findings are consistent with osteoarthritis of the hand.  Xr Hand 2 View Right  Result Date: 01/14/2018 PIP and DIP narrowing was noted.  No MCP joint narrowing or intercarpal or radiocarpal joint space narrowing was noted.  Hardware was noted in the right radius. Impression: findings are consistent with osteoarthritis of her hand.  Xr Knee 1-2 Views Right  Result Date: 01/14/2018 Moderate medial compartment narrowing with medial osteophytes and intercondylar osteophytes were noted.  No chondrocalcinosis was noted.  Moderate patellofemoral narrowing was noted. Impression: These findings are consistent with moderate osteoarthritis and moderate chondromalacia patella.  Xr Knee 3 View Left  Result Date: 01/14/2018 Mild to moderate medial compartment narrowing was noted.  Intercondylar osteophytes were noted.  No chondrocalcinosis was noted.  Mild patellofemoral narrowing was noted. Impression: These findings are consistent with mild to moderate osteoarthritis of the knee joints and mild chondromalacia patella.   Speciality Comments: No specialty comments available.    Procedures:  No procedures performed Allergies: Codeine   Assessment / Plan:     Visit Diagnoses: Primary osteoarthritis of both hands -her clinical findings are consistent with  osteoarthritis.  I do not see any synovitis on examination today.  Plan: XR Hand 2 View Right, XR Hand 2 View Left.  The x-rays were consistent with osteoarthritis as well.  I do not see any need to do any further autoimmune workup at this time.  She has lost some muscle mass in her hand as she was in a brace for a while.  I have advised her to continue to work with her hands and exercise.  If she develops any new symptoms or swelling she should notify me.  Chronic pain of left knee - Plan: XR KNEE 3 VIEW LEFT.  The x-rays were consistent with moderate osteoarthritis and moderate chondromalacia patella  History of patellar fracture - Right, non-displacedFollowed by Dr. Marlou Sa.  - Plan: CANCELED: XR Knee 1-2 Views Right.  The right knee joint x-ray also showed moderate osteoarthritis and moderate chondromalacia patella.  Frequent falls: She has had 2 falls and she feels unstable on her feet.  I offered physical therapy but she declined.  I have given her a handout on knee joint exercises.  She states she will continue to walk for right now.  She has had 2 fractures per both for after a fall.  I have advised her to discuss DEXA scan with her PCP.  Use of calcium and vitamin D on a regular basis was also discussed.  Resistive exercises were discussed as well.  Thrombocytopenia ([facility])  Gastroesophageal reflux disease without esophagitis  Other hyperlipidemia  History of breast cancer - Left breast 1998, lumpectomy, chemotherapy, radiation therapy.  Followed up by Dr. Carolynn Sayers.    Orders: Orders Placed This Encounter  Procedures  . XR Hand 2 View Right  . XR Hand 2 View Left  . XR KNEE 3 VIEW LEFT   No orders of the defined types were placed in this encounter.   Face-to-face time spent with patient was 45 minutes.  Greater than 50% of time was spent in counseling and coordination of care.  Follow-Up Instructions: Return if symptoms worsen or fail to improve, for Osteoarthritis.   Bo Merino, MD  Note - This record has been created using Editor, commissioning.  Chart creation errors have been sought, but may not always  have been located. Such creation errors do not reflect on  the standard of medical care.

## 2018-01-14 ENCOUNTER — Ambulatory Visit (INDEPENDENT_AMBULATORY_CARE_PROVIDER_SITE_OTHER): Payer: Self-pay

## 2018-01-14 ENCOUNTER — Encounter: Payer: Self-pay | Admitting: Rheumatology

## 2018-01-14 ENCOUNTER — Ambulatory Visit (INDEPENDENT_AMBULATORY_CARE_PROVIDER_SITE_OTHER): Payer: Medicare Other | Admitting: Rheumatology

## 2018-01-14 ENCOUNTER — Other Ambulatory Visit: Payer: Self-pay | Admitting: Rheumatology

## 2018-01-14 ENCOUNTER — Ambulatory Visit (INDEPENDENT_AMBULATORY_CARE_PROVIDER_SITE_OTHER): Payer: Medicare Other

## 2018-01-14 VITALS — BP 147/90 | HR 73 | Resp 15 | Ht 66.0 in | Wt 199.0 lb

## 2018-01-14 DIAGNOSIS — M19042 Primary osteoarthritis, left hand: Secondary | ICD-10-CM | POA: Diagnosis not present

## 2018-01-14 DIAGNOSIS — Z8781 Personal history of (healed) traumatic fracture: Secondary | ICD-10-CM

## 2018-01-14 DIAGNOSIS — Z853 Personal history of malignant neoplasm of breast: Secondary | ICD-10-CM | POA: Diagnosis not present

## 2018-01-14 DIAGNOSIS — R296 Repeated falls: Secondary | ICD-10-CM | POA: Diagnosis not present

## 2018-01-14 DIAGNOSIS — M25562 Pain in left knee: Secondary | ICD-10-CM

## 2018-01-14 DIAGNOSIS — M19041 Primary osteoarthritis, right hand: Secondary | ICD-10-CM

## 2018-01-14 DIAGNOSIS — M25561 Pain in right knee: Secondary | ICD-10-CM

## 2018-01-14 DIAGNOSIS — E7849 Other hyperlipidemia: Secondary | ICD-10-CM

## 2018-01-14 DIAGNOSIS — G8929 Other chronic pain: Secondary | ICD-10-CM

## 2018-01-14 DIAGNOSIS — K219 Gastro-esophageal reflux disease without esophagitis: Secondary | ICD-10-CM | POA: Diagnosis not present

## 2018-01-14 DIAGNOSIS — D696 Thrombocytopenia, unspecified: Secondary | ICD-10-CM

## 2018-01-14 NOTE — Patient Instructions (Signed)
Knee Exercises Ask your health care provider which exercises are safe for you. Do exercises exactly as told by your health care provider and adjust them as directed. It is normal to feel mild stretching, pulling, tightness, or discomfort as you do these exercises, but you should stop right away if you feel sudden pain or your pain gets worse.Do not begin these exercises until told by your health care provider. STRETCHING AND RANGE OF MOTION EXERCISES These exercises warm up your muscles and joints and improve the movement and flexibility of your knee. These exercises also help to relieve pain, numbness, and tingling. Exercise A: Knee Extension, Prone 1. Lie on your abdomen on a bed. 2. Place your left / right knee just beyond the edge of the surface so your knee is not on the bed. You can put a towel under your left / right thigh just above your knee for comfort. 3. Relax your leg muscles and allow gravity to straighten your knee. You should feel a stretch behind your left / right knee. 4. Hold this position for __________ seconds. 5. Scoot up so your knee is supported between repetitions. Repeat __________ times. Complete this stretch __________ times a day. Exercise B: Knee Flexion, Active  1. Lie on your back with both knees straight. If this causes back discomfort, bend your left / right knee so your foot is flat on the floor. 2. Slowly slide your left / right heel back toward your buttocks until you feel a gentle stretch in the front of your knee or thigh. 3. Hold this position for __________ seconds. 4. Slowly slide your left / right heel back to the starting position. Repeat __________ times. Complete this exercise __________ times a day. Exercise C: Quadriceps, Prone  1. Lie on your abdomen on a firm surface, such as a bed or padded floor. 2. Bend your left / right knee and hold your ankle. If you cannot reach your ankle or pant leg, loop a belt around your foot and grab the belt  instead. 3. Gently pull your heel toward your buttocks. Your knee should not slide out to the side. You should feel a stretch in the front of your thigh and knee. 4. Hold this position for __________ seconds. Repeat __________ times. Complete this stretch __________ times a day. Exercise D: Hamstring, Supine 1. Lie on your back. 2. Loop a belt or towel over the ball of your left / right foot. The ball of your foot is on the walking surface, right under your toes. 3. Straighten your left / right knee and slowly pull on the belt to raise your leg until you feel a gentle stretch behind your knee. ? Do not let your left / right knee bend while you do this. ? Keep your other leg flat on the floor. 4. Hold this position for __________ seconds. Repeat __________ times. Complete this stretch __________ times a day. STRENGTHENING EXERCISES These exercises build strength and endurance in your knee. Endurance is the ability to use your muscles for a long time, even after they get tired. Exercise E: Quadriceps, Isometric  1. Lie on your back with your left / right leg extended and your other knee bent. Put a rolled towel or small pillow under your knee if told by your health care provider. 2. Slowly tense the muscles in the front of your left / right thigh. You should see your kneecap slide up toward your hip or see increased dimpling just above the knee. This   motion will push the back of the knee toward the floor. 3. For __________ seconds, keep the muscle as tight as you can without increasing your pain. 4. Relax the muscles slowly and completely. Repeat __________ times. Complete this exercise __________ times a day. Exercise F: Straight Leg Raises - Quadriceps 1. Lie on your back with your left / right leg extended and your other knee bent. 2. Tense the muscles in the front of your left / right thigh. You should see your kneecap slide up or see increased dimpling just above the knee. Your thigh may  even shake a bit. 3. Keep these muscles tight as you raise your leg 4-6 inches (10-15 cm) off the floor. Do not let your knee bend. 4. Hold this position for __________ seconds. 5. Keep these muscles tense as you lower your leg. 6. Relax your muscles slowly and completely after each repetition. Repeat __________ times. Complete this exercise __________ times a day. Exercise G: Hamstring, Isometric 1. Lie on your back on a firm surface. 2. Bend your left / right knee approximately __________ degrees. 3. Dig your left / right heel into the surface as if you are trying to pull it toward your buttocks. Tighten the muscles in the back of your thighs to dig as hard as you can without increasing any pain. 4. Hold this position for __________ seconds. 5. Release the tension gradually and allow your muscles to relax completely for __________ seconds after each repetition. Repeat __________ times. Complete this exercise __________ times a day. Exercise H: Hamstring Curls  If told by your health care provider, do this exercise while wearing ankle weights. Begin with __________ weights. Then increase the weight by 1 lb (0.5 kg) increments. Do not wear ankle weights that are more than __________. 1. Lie on your abdomen with your legs straight. 2. Bend your left / right knee as far as you can without feeling pain. Keep your hips flat against the floor. 3. Hold this position for __________ seconds. 4. Slowly lower your leg to the starting position.  Repeat __________ times. Complete this exercise __________ times a day. Exercise I: Squats (Quadriceps) 1. Stand in front of a table, with your feet and knees pointing straight ahead. You may rest your hands on the table for balance but not for support. 2. Slowly bend your knees and lower your hips like you are going to sit in a chair. ? Keep your weight over your heels, not over your toes. ? Keep your lower legs upright so they are parallel with the table  legs. ? Do not let your hips go lower than your knees. ? Do not bend lower than told by your health care provider. ? If your knee pain increases, do not bend as low. 3. Hold the squat position for __________ seconds. 4. Slowly push with your legs to return to standing. Do not use your hands to pull yourself to standing. Repeat __________ times. Complete this exercise __________ times a day. Exercise J: Wall Slides (Quadriceps)  1. Lean your back against a smooth wall or door while you walk your feet out 18-24 inches (46-61 cm) from it. 2. Place your feet hip-width apart. 3. Slowly slide down the wall or door until your knees bend __________ degrees. Keep your knees over your heels, not over your toes. Keep your knees in line with your hips. 4. Hold for __________ seconds. Repeat __________ times. Complete this exercise __________ times a day. Exercise K: Straight Leg Raises -   Hip Abductors 1. Lie on your side with your left / right leg in the top position. Lie so your head, shoulder, knee, and hip line up. You may bend your bottom knee to help you keep your balance. 2. Roll your hips slightly forward so your hips are stacked directly over each other and your left / right knee is facing forward. 3. Leading with your heel, lift your top leg 4-6 inches (10-15 cm). You should feel the muscles in your outer hip lifting. ? Do not let your foot drift forward. ? Do not let your knee roll toward the ceiling. 4. Hold this position for __________ seconds. 5. Slowly return your leg to the starting position. 6. Let your muscles relax completely after each repetition. Repeat __________ times. Complete this exercise __________ times a day. Exercise L: Straight Leg Raises - Hip Extensors 1. Lie on your abdomen on a firm surface. You can put a pillow under your hips if that is more comfortable. 2. Tense the muscles in your buttocks and lift your left / right leg about 4-6 inches (10-15 cm). Keep your knee  straight as you lift your leg. 3. Hold this position for __________ seconds. 4. Slowly lower your leg to the starting position. 5. Let your leg relax completely after each repetition. Repeat __________ times. Complete this exercise __________ times a day. This information is not intended to replace advice given to you by your health care provider. Make sure you discuss any questions you have with your health care provider. Document Released: 08/22/2005 Document Revised: 07/02/2016 Document Reviewed: 08/14/2015 Elsevier Interactive Patient Education  2018 [employer] Hand Exercises Hand exercises can be helpful to almost anyone. These exercises can strengthen the hands, improve flexibility and movement, and increase blood flow to the hands. These results can make work and daily tasks easier. Hand exercises can be especially helpful for people who have joint pain from arthritis or have nerve damage from overuse (carpal tunnel syndrome). These exercises can also help people who have injured a hand. Most of these hand exercises are fairly gentle stretching routines. You can do them often throughout the day. Still, it is a good idea to ask your health care provider which exercises would be best for you. Warming your hands before exercise may help to reduce stiffness. You can do this with gentle massage or by placing your hands in warm water for 15 minutes. Also, make sure you pay attention to your level of hand pain as you begin an exercise routine. Exercises Knuckle Bend Repeat this exercise 5-10 times with each hand. 1. Stand or sit with your arm, hand, and all five fingers pointed straight up. Make sure your wrist is straight. 2. Gently and slowly bend your fingers down and inward until the tips of your fingers are touching the tops of your palm. 3. Hold this position for a few seconds. 4. Extend your fingers out to their original position, all pointing straight up again.  Finger Fan Repeat this  exercise 5-10 times with each hand. 1. Hold your arm and hand out in front of you. Keep your wrist straight. 2. Squeeze your hand into a fist. 3. Hold this position for a few seconds. 4. Fan out, or spread apart, your hand and fingers as much as possible, stretching every joint fully.  Tabletop Repeat this exercise 5-10 times with each hand. 1. Stand or sit with your arm, hand, and all five fingers pointed straight up. Make sure your wrist is straight.   2. Gently and slowly bend your fingers at the knuckles where they meet the hand until your hand is making an upside-down L shape. Your fingers should form a tabletop. 3. Hold this position for a few seconds. 4. Extend your fingers out to their original position, all pointing straight up again.  Making Os Repeat this exercise 5-10 times with each hand. 1. Stand or sit with your arm, hand, and all five fingers pointed straight up. Make sure your wrist is straight. 2. Make an O shape by touching your pointer finger to your thumb. Hold for a few seconds. Then open your hand wide. 3. Repeat this motion with each finger on your hand.  Table Spread Repeat this exercise 5-10 times with each hand. 1. Place your hand on a table with your palm facing down. Make sure your wrist is straight. 2. Spread your fingers out as much as possible. Hold this position for a few seconds. 3. Slide your fingers back together again. Hold for a few seconds.  Ball Grip  Repeat this exercise 10-15 times with each hand. 1. Hold a tennis ball or another soft ball in your hand. 2. While slowly increasing pressure, squeeze the ball as hard as possible. 3. Squeeze as hard as you can for 3-5 seconds. 4. Relax and repeat.  Wrist Curls Repeat this exercise 10-15 times with each hand. 1. Sit in a chair that has armrests. 2. Hold a light weight in your hand, such as a dumbbell that weighs 1-3 pounds (0.5-1.4 kg). Ask your health care provider what weight would be best for  you. 3. Rest your hand just over the end of the chair arm with your palm facing up. 4. Gently pivot your wrist up and down while holding the weight. Do not twist your wrist from side to side.  Contact a health care provider if:  Your hand pain or discomfort gets much worse when you do an exercise.  Your hand pain or discomfort does not improve within 2 hours after you exercise. If you have any of these problems, stop doing these exercises right away. Do not do them again unless your health care provider says that you can. Get help right away if:  You develop sudden, severe hand pain. If this happens, stop doing these exercises right away. Do not do them again unless your health care provider says that you can. This information is not intended to replace advice given to you by your health care provider. Make sure you discuss any questions you have with your health care provider. Document Released: 09/19/2015 Document Revised: 03/15/2016 Document Reviewed: 04/18/2015 Elsevier Interactive Patient Education  2018 [employer]  

## 2018-02-20 ENCOUNTER — Ambulatory Visit: Payer: Medicare Other | Admitting: Rheumatology

## 2018-03-25 ENCOUNTER — Other Ambulatory Visit: Payer: Self-pay | Admitting: Family Medicine

## 2018-03-25 ENCOUNTER — Encounter: Payer: Self-pay | Admitting: Gastroenterology

## 2018-03-25 ENCOUNTER — Ambulatory Visit
Admission: RE | Admit: 2018-03-25 | Discharge: 2018-03-25 | Disposition: A | Discharge status: Home / Self Care | Payer: Medicare Other | Source: Ambulatory Visit | Attending: Family Medicine | Admitting: Family Medicine

## 2018-03-25 DIAGNOSIS — R0602 Shortness of breath: Secondary | ICD-10-CM

## 2018-03-25 DIAGNOSIS — R52 Pain, unspecified: Secondary | ICD-10-CM

## 2018-05-13 ENCOUNTER — Telehealth: Payer: Self-pay

## 2018-05-13 NOTE — Telephone Encounter (Signed)
Patient No Showed for PV today. Both numbers were called and I left a message on her cell phone number to call and reschedule before 5:00 Pm today. If we don't hear from the patient her colonoscopy that is scheduled for 05/30/18 with Dr. Havery Moros will be cancelled per [place] guidelines. A no show letter will be mailed at the end of the day if patient doesn't call to reschedule.   Riki Sheer, LPN ( PV )

## 2018-05-30 ENCOUNTER — Encounter: Payer: Medicare Other | Admitting: Gastroenterology

## 2018-06-18 ENCOUNTER — Telehealth: Payer: Self-pay

## 2018-06-18 ENCOUNTER — Ambulatory Visit (AMBULATORY_SURGERY_CENTER): Payer: Self-pay

## 2018-06-18 VITALS — Ht 66.0 in | Wt 196.8 lb

## 2018-06-18 DIAGNOSIS — Z1211 Encounter for screening for malignant neoplasm of colon: Secondary | ICD-10-CM

## 2018-06-18 MED ORDER — NA SULFATE-K SULFATE-MG SULF 17.5-3.13-1.6 GM/177ML PO SOLN: 1.0000 | Freq: Once | ORAL | 0 refills | Status: AC

## 2018-06-18 NOTE — Progress Notes (Signed)
Per pt, no allergies to soy or egg products.Pt not taking any weight loss meds or using  O2 at home.  Pt refused emmi video.  During the PV, I spent over 50 minutes with pt discussing her medical history and medication. Pt was unsure of several meds. The pt will call back today to verify the names of some of her medications. Pt had many questions and I tried to answer them during her PV. Gwyndolyn Saxon

## 2018-06-18 NOTE — Telephone Encounter (Signed)
Pt came into the office today for her PV prior to her colon on 06/30/18. Pt did not know the names of certain medications or why she was taking them. Pt to call our office back today to follow up on the names of medication she is taking. [name]

## 2018-06-19 NOTE — Telephone Encounter (Signed)
Pt called back to inform us she takes Cephalexin prn for any infections.  I reviewed her meds and answered the pt's questions. She will call back with any other questions. Gwyndolyn Saxon

## 2018-06-19 NOTE — Telephone Encounter (Signed)
Called pt on cell # and LM to call our office. [name]

## 2018-06-30 ENCOUNTER — Encounter: Payer: Self-pay | Admitting: Gastroenterology

## 2018-06-30 ENCOUNTER — Ambulatory Visit (AMBULATORY_SURGERY_CENTER): Payer: Medicare Other | Admitting: Gastroenterology

## 2018-06-30 VITALS — BP 156/81 | HR 67 | Temp 97.5°F | Resp 13 | Ht 66.0 in | Wt 199.0 lb

## 2018-06-30 DIAGNOSIS — D122 Benign neoplasm of ascending colon: Secondary | ICD-10-CM

## 2018-06-30 DIAGNOSIS — D123 Benign neoplasm of transverse colon: Secondary | ICD-10-CM

## 2018-06-30 DIAGNOSIS — D120 Benign neoplasm of cecum: Secondary | ICD-10-CM

## 2018-06-30 DIAGNOSIS — Z1211 Encounter for screening for malignant neoplasm of colon: Secondary | ICD-10-CM | POA: Diagnosis present

## 2018-06-30 MED ORDER — SODIUM CHLORIDE 0.9 % IV SOLN: 500.0000 mL | Freq: Once | INTRAVENOUS | Status: DC

## 2018-06-30 NOTE — Op Note (Signed)
[facility] Patient Name: [name] Procedure Date: 06/30/2018 2:06 PM MRN: [id number] Endoscopist: Remo Lipps P. Havery Moros , MD Age: 72 Referring MD:  Date of Birth: Apr 26, 1946 Gender: Female Account #: [id number] Procedure:                Colonoscopy Indications:              Screening for colorectal malignant neoplasm Medicines:                Monitored Anesthesia Care Procedure:                Pre-Anesthesia Assessment:                           - Prior to the procedure, a History and Physical                            was performed, and patient medications and                            allergies were reviewed. The patient's tolerance of                            previous anesthesia was also reviewed. The risks                            and benefits of the procedure and the sedation                            options and risks were discussed with the patient.                            All questions were answered, and informed consent                            was obtained. Prior Anticoagulants: The patient has                            taken no previous anticoagulant or antiplatelet                            agents. ASA Grade Assessment: II - A patient with                            mild systemic disease. After reviewing the risks                            and benefits, the patient was deemed in                            satisfactory condition to undergo the procedure.                           After obtaining informed consent, the colonoscope  was passed under direct vision. Throughout the                            procedure, the patient's blood pressure, pulse, and                            oxygen saturations were monitored continuously. The                            Colonoscope was introduced through the anus and                            advanced to the the cecum, identified by                            appendiceal orifice  and ileocecal valve. The                            colonoscopy was performed without difficulty. The                            patient tolerated the procedure well. The quality                            of the bowel preparation was adequate. The                            ileocecal valve, appendiceal orifice, and rectum                            were photographed. Scope In: 2:12:21 PM Scope Out: 2:35:24 PM Scope Withdrawal Time: 0 hours 19 minutes 21 seconds  Total Procedure Duration: 0 hours 23 minutes 3 seconds  Findings:                 The perianal and digital rectal examinations were                            normal.                           Two sessile polyps were found in the cecum. The                            polyps were 2 to 4 mm in size. These polyps were                            removed with a cold snare. Resection and retrieval                            were complete.                           Two sessile polyps were found in the ascending  colon. The polyps were 2 to 6 mm in size. These                            polyps were removed with a cold snare. Resection                            and retrieval were complete.                           Three sessile polyps were found in the transverse                            colon. The polyps were 3 to 7 mm in size. These                            polyps were removed with a cold snare. Resection                            and retrieval were complete.                           A diminutive polyp was found in the transverse                            colon. The polyp was sessile. The polyp was removed                            with a cold biopsy forceps. Resection and retrieval                            were complete.                           Multiple medium-mouthed diverticula were found in                            the transverse colon and left colon.                           Internal  hemorrhoids were found during retroflexion.                           The exam was otherwise without abnormality. Complications:            No immediate complications. Estimated blood loss:                            Minimal. Estimated Blood Loss:     Estimated blood loss was minimal. Impression:               - Two 2 to 4 mm polyps in the cecum, removed with a                            cold snare. Resected and retrieved.                           -  Two 2 to 6 mm polyps in the ascending colon,                            removed with a cold snare. Resected and retrieved.                           - Three 3 to 7 mm polyps in the transverse colon,                            removed with a cold snare. Resected and retrieved.                           - One diminutive polyp in the transverse colon,                            removed with a cold biopsy forceps. Resected and                            retrieved.                           - Diverticulosis in the transverse colon and in the                            left colon.                           - Internal hemorrhoids.                           - The examination was otherwise normal. Recommendation:           - Patient has a contact number available for                            emergencies. The signs and symptoms of potential                            delayed complications were discussed with the                            patient. Return to normal activities tomorrow.                            Written discharge instructions were provided to the                            patient.                           - Resume previous diet.                           - Continue present medications.                           -  Await pathology results.                           - Repeat colonoscopy for surveillance based on                            pathology results. Remo Lipps P. Khila Papp, MD 06/30/2018 2:40:16 PM This report has been signed  electronically.

## 2018-06-30 NOTE — Progress Notes (Signed)
Report to PACU, RN, vss, BBS= Clear.  

## 2018-06-30 NOTE — Progress Notes (Signed)
Called to room to assist during endoscopic procedure.  Patient ID and intended procedure confirmed with present staff. Received instructions for my participation in the procedure from the performing physician.  

## 2018-06-30 NOTE — Patient Instructions (Signed)
YOU HAD AN ENDOSCOPIC PROCEDURE TODAY AT [facility]:   Refer to the procedure report that was given to you for any specific questions about what was found during the examination.  If the procedure report does not answer your questions, please call your gastroenterologist to clarify.  If you requested that your care partner not be given the details of your procedure findings, then the procedure report has been included in a sealed envelope for you to review at your convenience later.  YOU SHOULD EXPECT: Some feelings of bloating in the abdomen. Passage of more gas than usual.  Walking can help get rid of the air that was put into your GI tract during the procedure and reduce the bloating. If you had a lower endoscopy (such as a colonoscopy or flexible sigmoidoscopy) you may notice spotting of blood in your stool or on the toilet paper. If you underwent a bowel prep for your procedure, you may not have a normal bowel movement for a few days.  Please Note:  You might notice some irritation and congestion in your nose or some drainage.  This is from the oxygen used during your procedure.  There is no need for concern and it should clear up in a day or so.  SYMPTOMS TO REPORT IMMEDIATELY:   Following lower endoscopy (colonoscopy or flexible sigmoidoscopy):  Excessive amounts of blood in the stool  Significant tenderness or worsening of abdominal pains  Swelling of the abdomen that is new, acute  Fever of 100F or higher  Please see handouts on polyps and diverticulosis.  For urgent or emergent issues, a gastroenterologist can be reached at any hour by calling [phone].   DIET:  We do recommend a small meal at first, but then you may proceed to your regular diet.  Drink plenty of fluids but you should avoid alcoholic beverages for 24 hours.  ACTIVITY:  You should plan to take it easy for the rest of today and you should NOT DRIVE or use heavy machinery until tomorrow (because  of the sedation medicines used during the test).    FOLLOW UP: Our staff will call the number listed on your records the next business day following your procedure to check on you and address any questions or concerns that you may have regarding the information given to you following your procedure. If we do not reach you, we will leave a message.  However, if you are feeling well and you are not experiencing any problems, there is no need to return our call.  We will assume that you have returned to your regular daily activities without incident.  If any biopsies were taken you will be contacted by phone or by letter within the next 1-3 weeks.  Please call us at [phone] if you have not heard about the biopsies in 3 weeks.    SIGNATURES/CONFIDENTIALITY: You and/or your care partner have signed paperwork which will be entered into your electronic medical record.  These signatures attest to the fact that that the information above on your After Visit Summary has been reviewed and is understood.  Full responsibility of the confidentiality of this discharge information lies with you and/or your care-partner.   Thank you for allowing [place] to provide your healthcare today.

## 2018-07-01 ENCOUNTER — Telehealth: Payer: Self-pay

## 2018-07-01 NOTE — Telephone Encounter (Signed)
  Follow up Call-  Call back number 06/30/2018  Post procedure Call Back phone  # [phone]  Permission to leave phone message Yes  Some recent data might be hidden     Patient questions:  Do you have a fever, pain , or abdominal swelling? No. Pain Score  0 *  Have you tolerated food without any problems? Yes.    Have you been able to return to your normal activities? Yes.    Do you have any questions about your discharge instructions: Diet   No. Medications  No. Follow up visit  No.  Do you have questions or concerns about your Care? No.  Actions: * If pain score is 4 or above: No action needed, pain <4.

## 2018-07-09 ENCOUNTER — Encounter: Payer: Self-pay | Admitting: Gastroenterology

## 2018-09-09 ENCOUNTER — Other Ambulatory Visit: Payer: Self-pay | Admitting: Obstetrics and Gynecology

## 2018-09-09 ENCOUNTER — Ambulatory Visit
Admission: RE | Admit: 2018-09-09 | Discharge: 2018-09-09 | Disposition: A | Discharge status: Home / Self Care | Payer: Medicare Other | Source: Ambulatory Visit | Attending: Obstetrics and Gynecology | Admitting: Obstetrics and Gynecology

## 2018-09-09 DIAGNOSIS — Z1231 Encounter for screening mammogram for malignant neoplasm of breast: Secondary | ICD-10-CM

## 2018-09-09 HISTORY — DX: Personal history of antineoplastic chemotherapy: Z92.21

## 2018-09-09 HISTORY — DX: Personal history of irradiation: Z92.3

## 2018-09-11 ENCOUNTER — Other Ambulatory Visit: Payer: Self-pay | Admitting: Obstetrics and Gynecology

## 2018-09-11 DIAGNOSIS — R928 Other abnormal and inconclusive findings on diagnostic imaging of breast: Secondary | ICD-10-CM

## 2018-09-16 ENCOUNTER — Ambulatory Visit
Admission: RE | Admit: 2018-09-16 | Discharge: 2018-09-16 | Disposition: A | Discharge status: Home / Self Care | Payer: Medicare Other | Source: Ambulatory Visit | Attending: Obstetrics and Gynecology | Admitting: Obstetrics and Gynecology

## 2018-09-16 ENCOUNTER — Other Ambulatory Visit: Payer: Self-pay | Admitting: Obstetrics and Gynecology

## 2018-09-16 DIAGNOSIS — N6310 Unspecified lump in the right breast, unspecified quadrant: Secondary | ICD-10-CM

## 2018-09-16 DIAGNOSIS — R928 Other abnormal and inconclusive findings on diagnostic imaging of breast: Secondary | ICD-10-CM

## 2018-09-23 ENCOUNTER — Ambulatory Visit
Admission: RE | Admit: 2018-09-23 | Discharge: 2018-09-23 | Disposition: A | Discharge status: Home / Self Care | Payer: Medicare Other | Source: Ambulatory Visit | Attending: Obstetrics and Gynecology | Admitting: Obstetrics and Gynecology

## 2018-09-23 DIAGNOSIS — N6310 Unspecified lump in the right breast, unspecified quadrant: Secondary | ICD-10-CM

## 2018-10-13 HISTORY — PX: OTHER SURGICAL HISTORY: SHX169

## 2019-03-28 IMAGING — DX DG KNEE COMPLETE 4+V*R*
4 series · 4 of 4 positions shown · non-contrast
Comparison: None.

CLINICAL DATA: 71 y/o F; fall 9 days ago with pain and swelling
anteriorly.

EXAM:
RIGHT KNEE - COMPLETE 4+ VIEW

[knee ap]
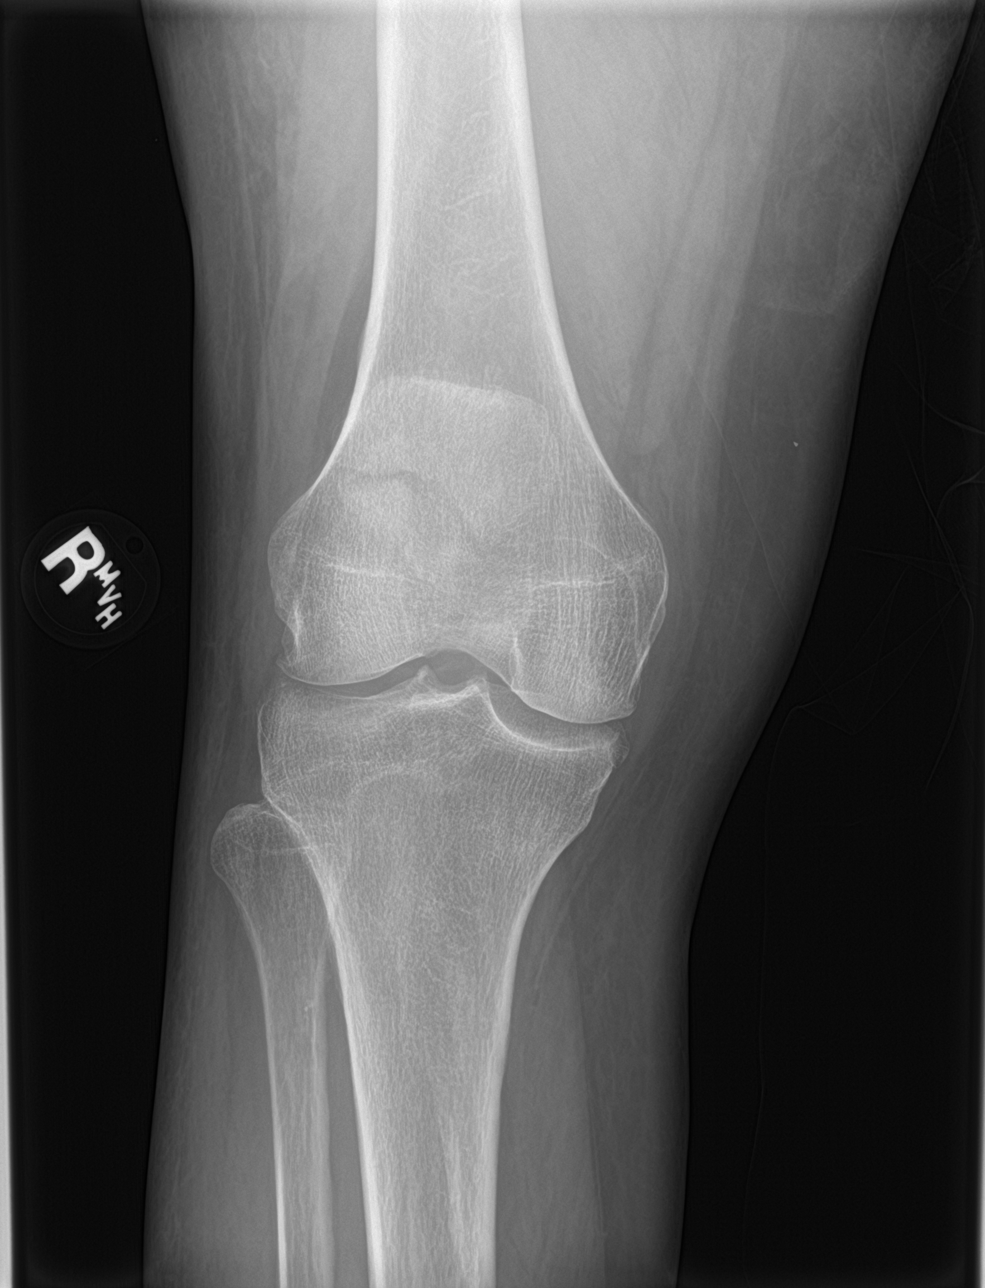

[knee lat]
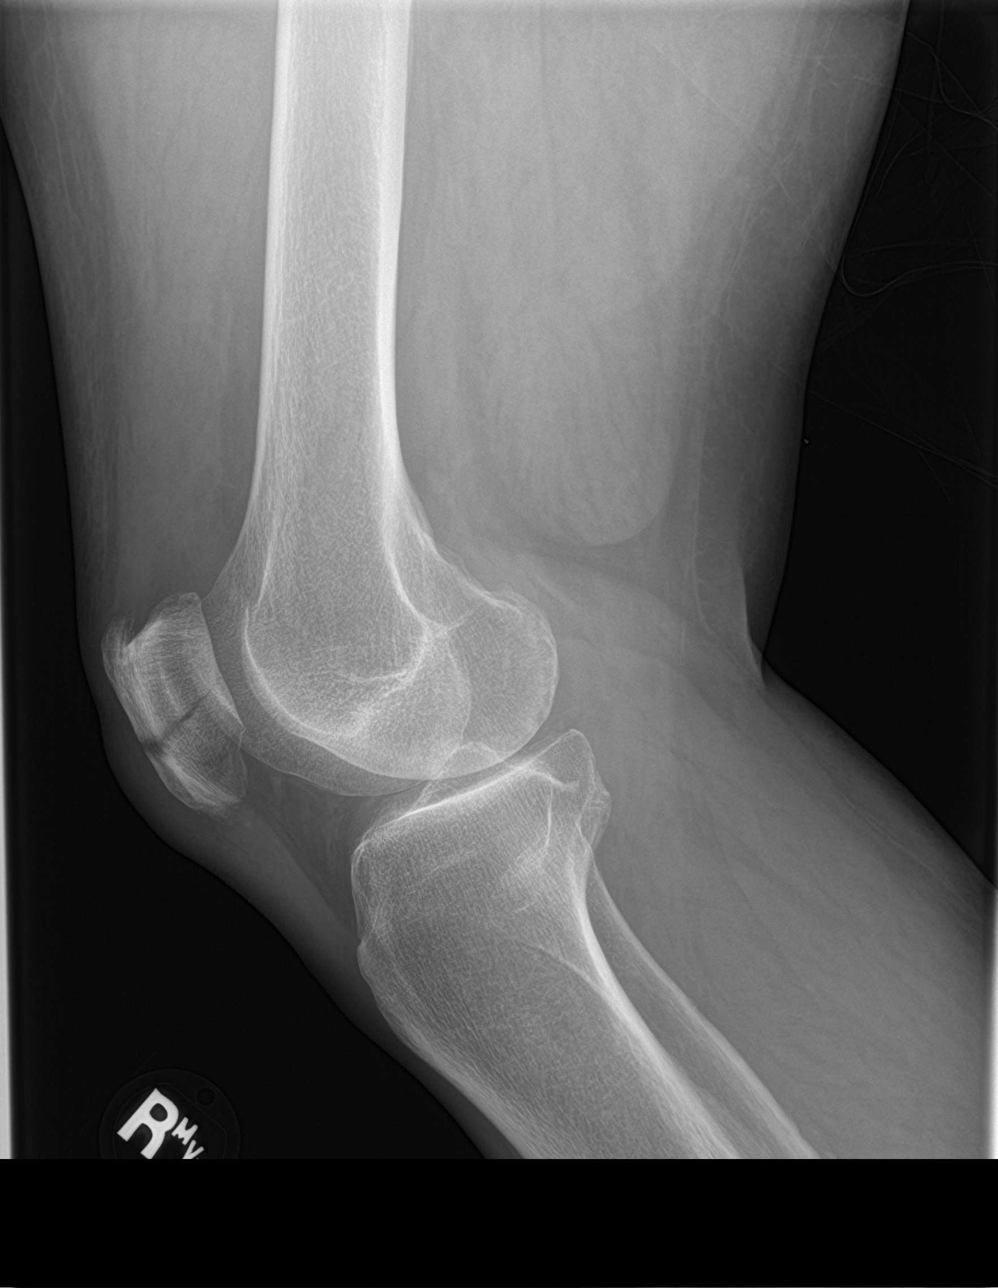

[knee obl (1 of 2)]
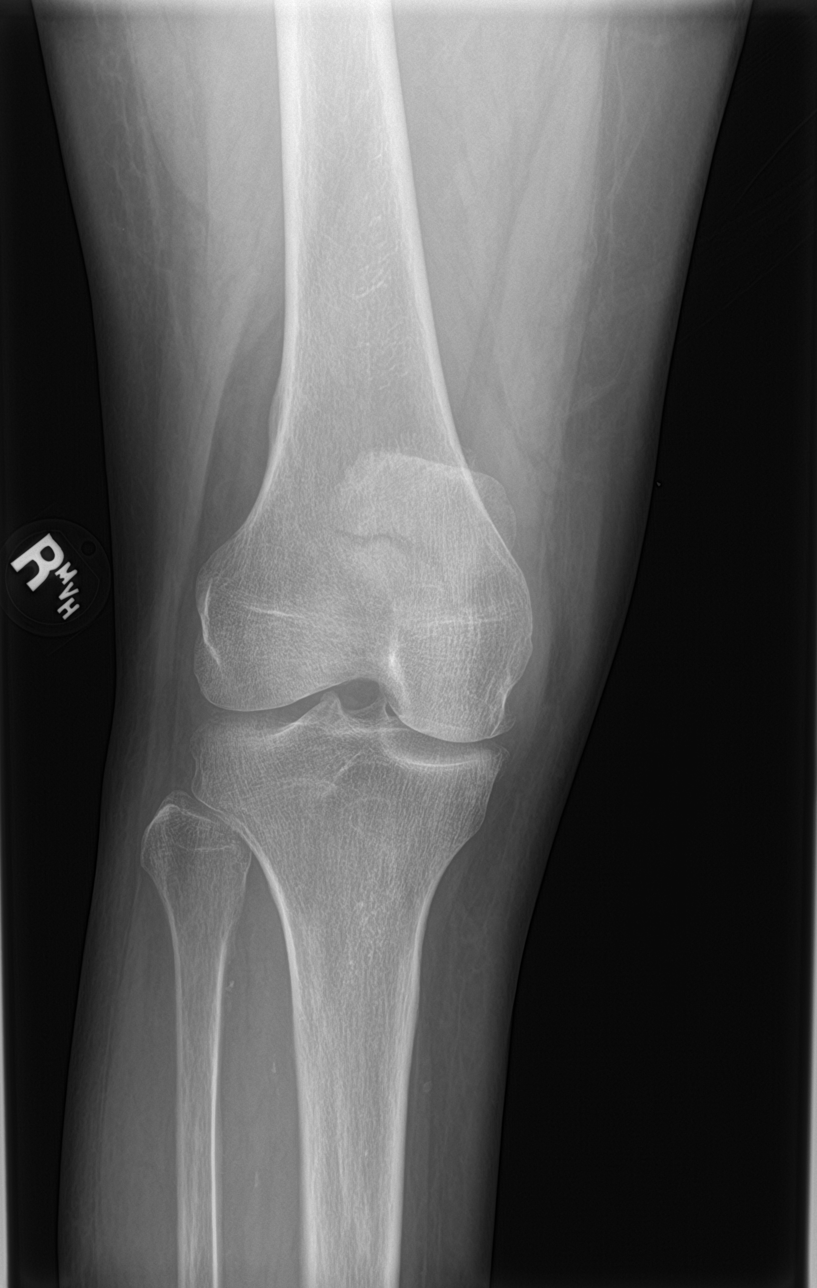

[knee obl (2 of 2)]
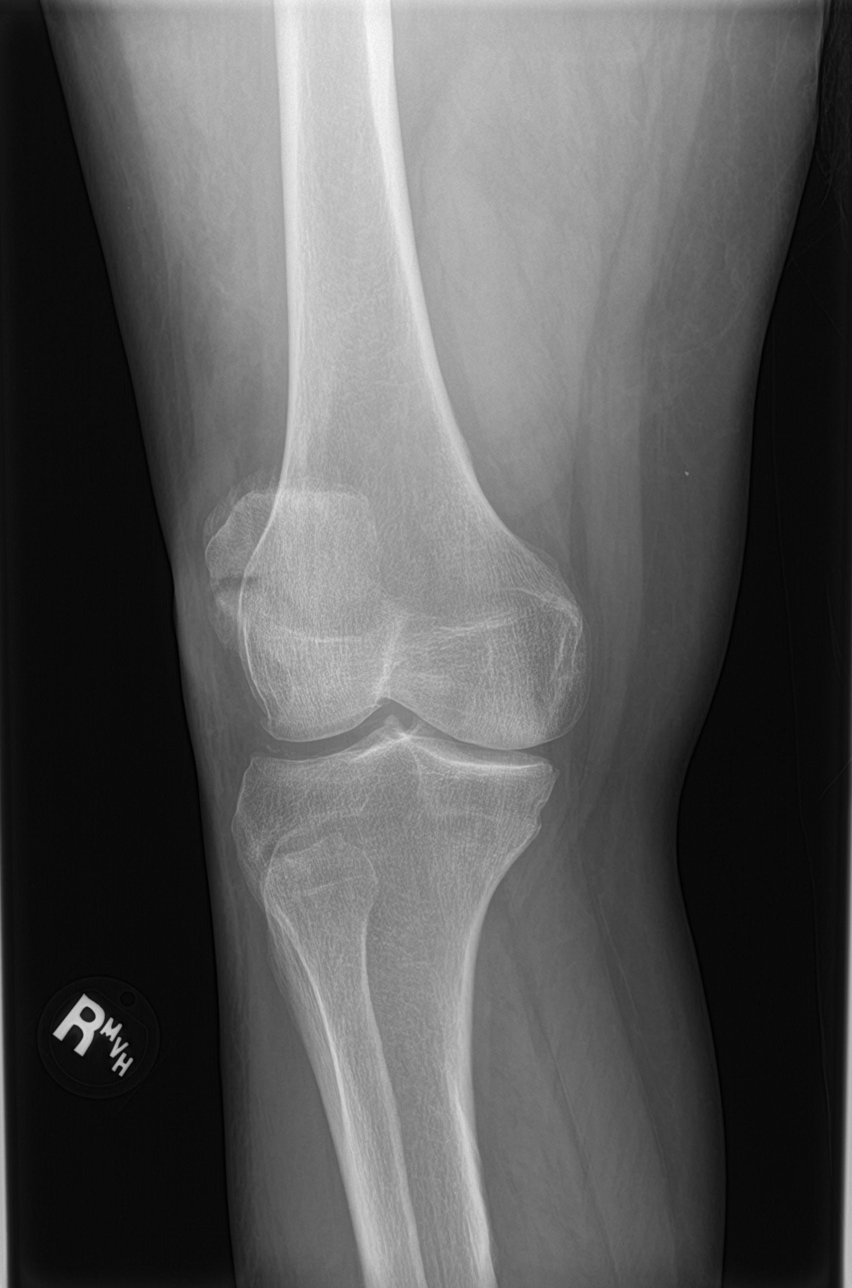

[4 of 4 positions shown; findings below may reference images not displayed]

FINDINGS: Acute minimally displaced trans mid patellar fracture. Joint spaces
are well maintained. Mild spurring of tibial spines. Mineralization
in the lateral femorotibial compartment compatible with
chondrocalcinosis. Soft tissue swelling anterior to the patella.
IMPRESSION: Acute minimally displaced transverse mid patellar fracture. No other
fracture or dislocation identified. Lateral femorotibial compartment
chondrocalcinosis.

By: Noemicita Fv M.D.

## 2019-06-16 ENCOUNTER — Other Ambulatory Visit: Payer: Self-pay | Admitting: Internal Medicine

## 2019-06-16 DIAGNOSIS — Z79811 Long term (current) use of aromatase inhibitors: Secondary | ICD-10-CM

## 2019-06-16 DIAGNOSIS — C50411 Malignant neoplasm of upper-outer quadrant of right female breast: Secondary | ICD-10-CM

## 2019-08-04 ENCOUNTER — Other Ambulatory Visit: Payer: Self-pay

## 2019-08-04 ENCOUNTER — Ambulatory Visit
Admission: RE | Admit: 2019-08-04 | Discharge: 2019-08-04 | Disposition: A | Discharge status: Home / Self Care | Payer: Medicare Other | Source: Ambulatory Visit | Attending: Internal Medicine | Admitting: Internal Medicine

## 2019-08-04 DIAGNOSIS — Z170 Estrogen receptor positive status [ER+]: Secondary | ICD-10-CM

## 2019-08-04 DIAGNOSIS — C50411 Malignant neoplasm of upper-outer quadrant of right female breast: Secondary | ICD-10-CM

## 2019-08-04 DIAGNOSIS — Z79811 Long term (current) use of aromatase inhibitors: Secondary | ICD-10-CM

## 2020-03-20 IMAGING — MG DIGITAL SCREENING BILATERAL MAMMOGRAM WITH TOMO AND CAD
8 series · 8 of 24 positions shown · non-contrast
Comparison: Previous exam(s).

CLINICAL DATA: Screening.

EXAM:
DIGITAL SCREENING BILATERAL MAMMOGRAM WITH TOMO AND CAD

[L MLO synth-2D]
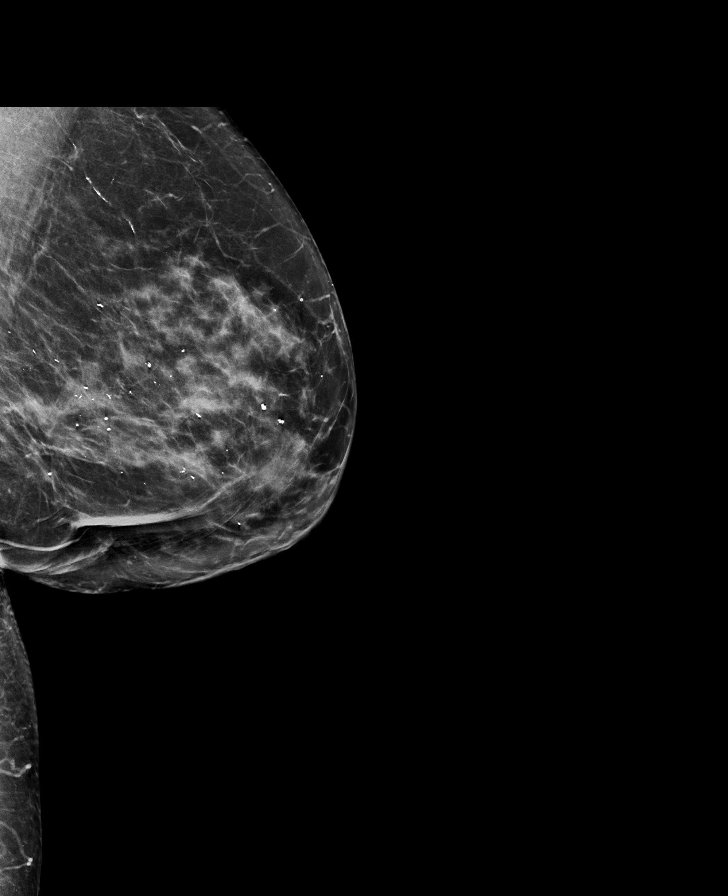

[R CC synth-2D]
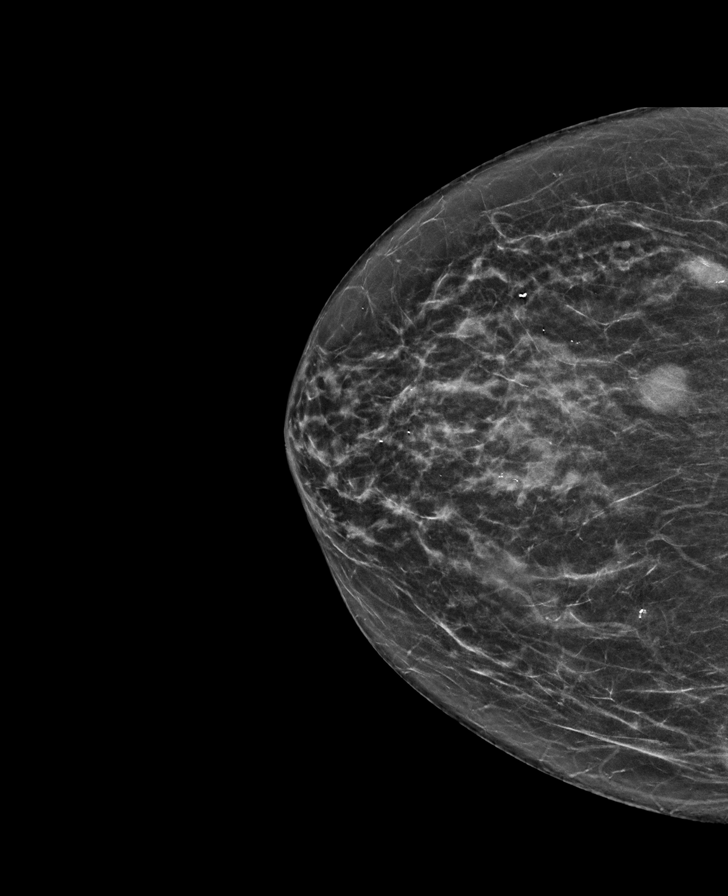

[L CC synth-2D]
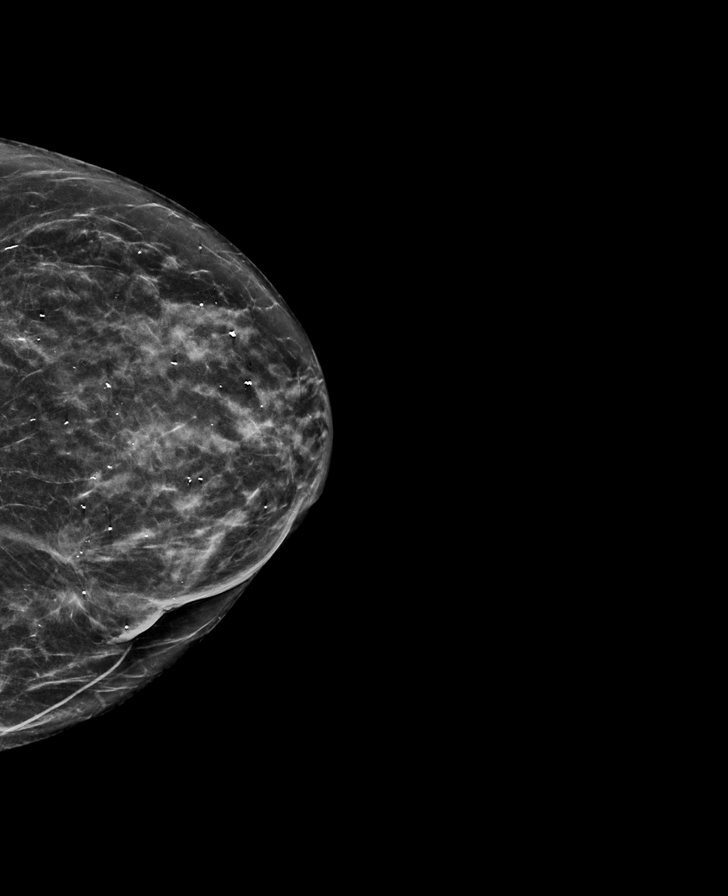

[R MLO synth-2D]
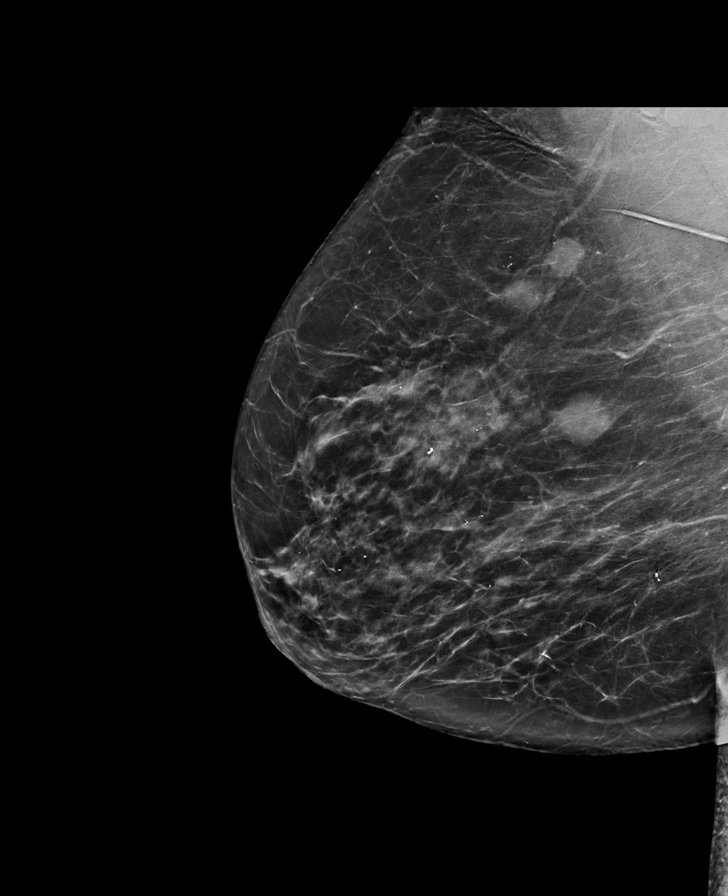

[R MLO tomo · tomo slice 35/70.0]
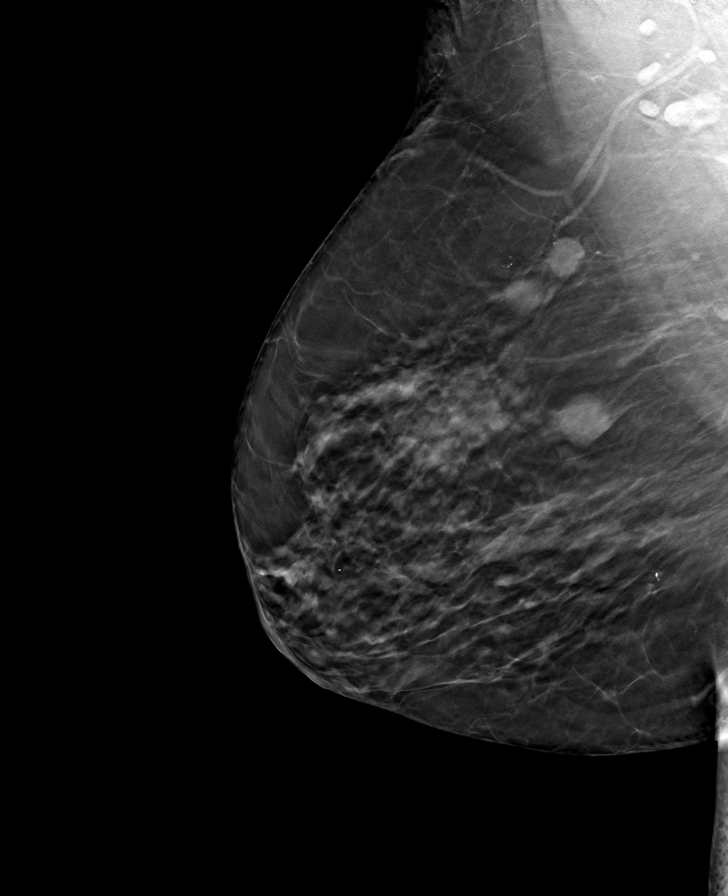

[R CC tomo · tomo slice 29/57.0]
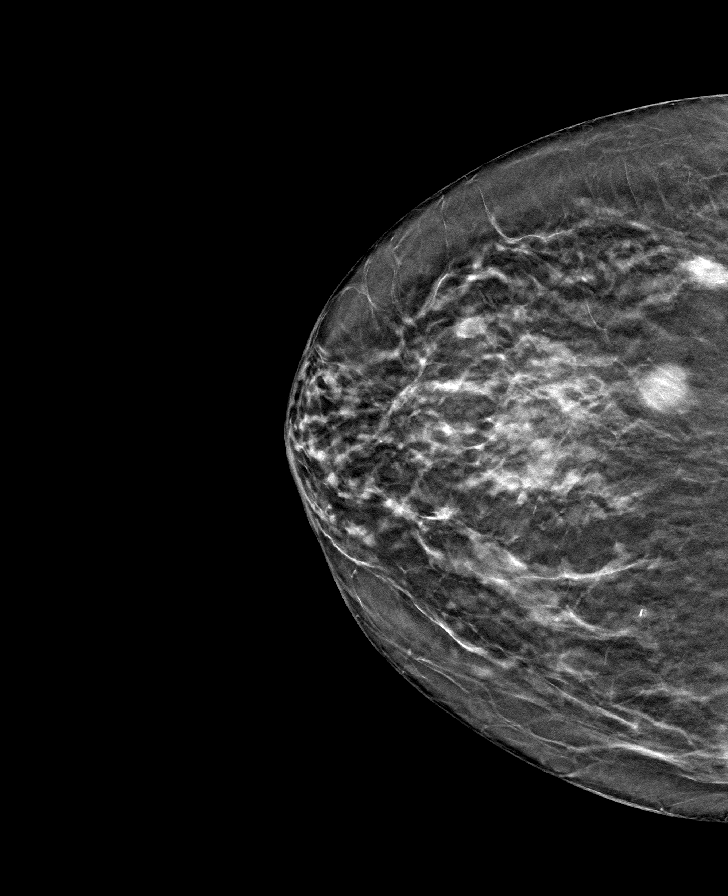

[L MLO tomo · tomo slice 35/70.0]
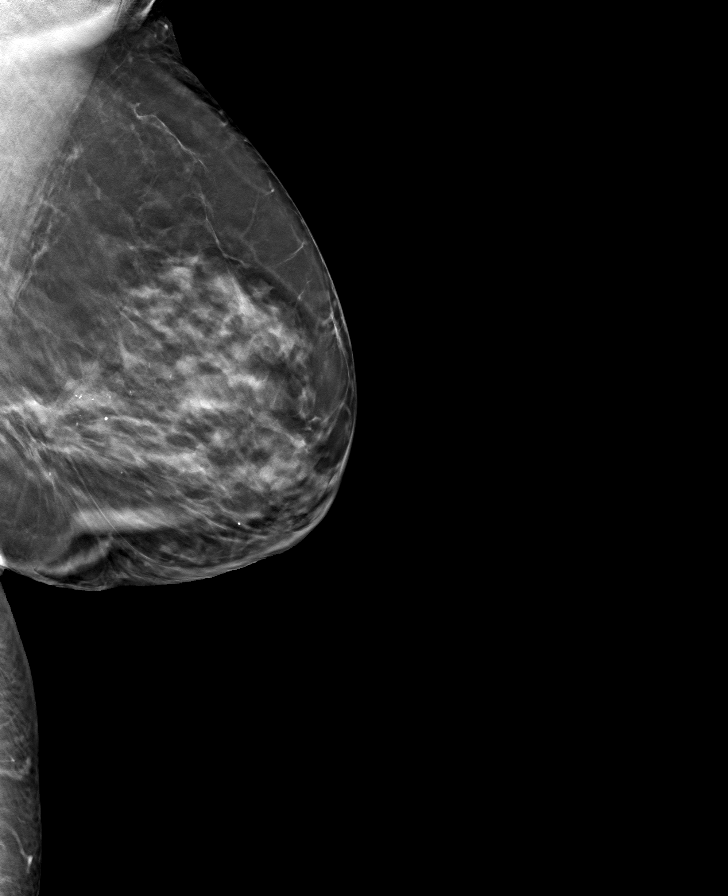

[L CC tomo · tomo slice 30/59.0]
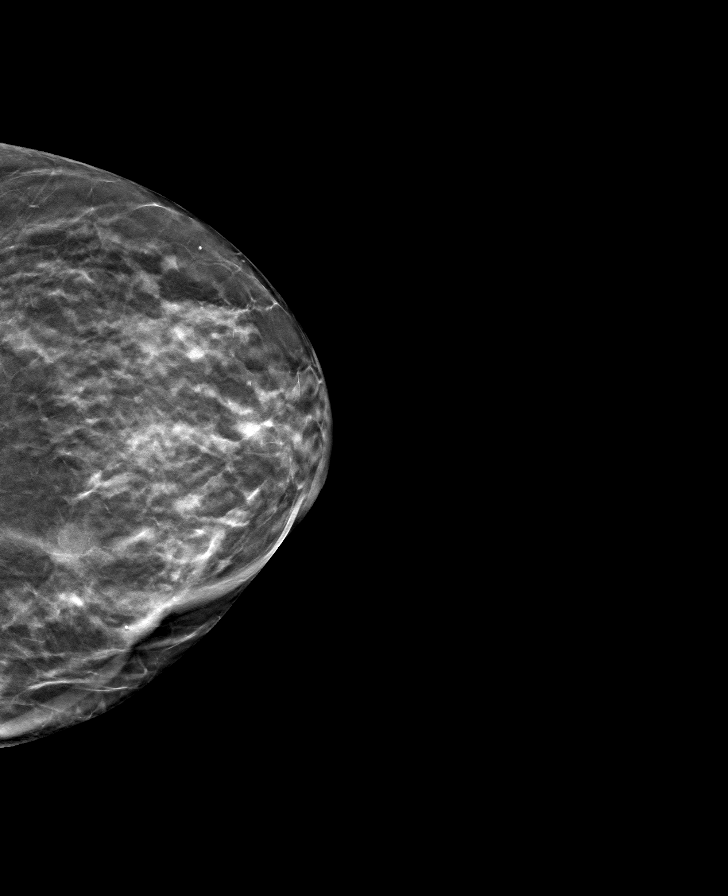

[8 of 24 positions shown; findings below may reference images not displayed]

ACR Breast Density Category c: The breast tissue is heterogeneously
dense, which may obscure small masses.
FINDINGS: In the right breast, possible masses warrant further evaluation. In
the left breast, no findings suspicious for malignancy. Images were
processed with CAD.
IMPRESSION: Further evaluation is suggested for possible masses in the right
breast.

RECOMMENDATION:
Diagnostic mammogram and possibly ultrasound of the right breast.
(Code:XV-L-999)

The patient will be contacted regarding the findings, and additional
imaging will be scheduled.

BI-RADS CATEGORY  0: Incomplete. Need additional imaging evaluation
and/or prior mammograms for comparison.

## 2021-04-14 ENCOUNTER — Ambulatory Visit (HOSPITAL_BASED_OUTPATIENT_CLINIC_OR_DEPARTMENT_OTHER): Payer: Medicare Other

## 2021-04-14 ENCOUNTER — Other Ambulatory Visit (HOSPITAL_BASED_OUTPATIENT_CLINIC_OR_DEPARTMENT_OTHER): Payer: Self-pay | Admitting: Physician Assistant

## 2021-04-14 ENCOUNTER — Other Ambulatory Visit: Payer: Self-pay

## 2021-04-14 ENCOUNTER — Ambulatory Visit (HOSPITAL_BASED_OUTPATIENT_CLINIC_OR_DEPARTMENT_OTHER)
Admission: RE | Admit: 2021-04-14 | Discharge: 2021-04-14 | Disposition: A | Discharge status: Home / Self Care | Payer: Medicare Other | Source: Ambulatory Visit | Attending: Physician Assistant | Admitting: Physician Assistant

## 2021-04-14 DIAGNOSIS — M7989 Other specified soft tissue disorders: Secondary | ICD-10-CM | POA: Insufficient documentation

## 2021-04-14 DIAGNOSIS — M79662 Pain in left lower leg: Secondary | ICD-10-CM

## 2021-07-22 DIAGNOSIS — I82432 Acute embolism and thrombosis of left popliteal vein: Secondary | ICD-10-CM

## 2021-07-22 HISTORY — DX: Acute embolism and thrombosis of left popliteal vein: I82.432

## 2021-08-08 ENCOUNTER — Encounter: Payer: Self-pay | Admitting: Gastroenterology

## 2021-09-06 ENCOUNTER — Encounter: Payer: Self-pay | Admitting: Gastroenterology

## 2021-10-31 ENCOUNTER — Other Ambulatory Visit (INDEPENDENT_AMBULATORY_CARE_PROVIDER_SITE_OTHER): Payer: Medicare PPO

## 2021-10-31 ENCOUNTER — Ambulatory Visit: Payer: Medicare PPO | Admitting: Gastroenterology

## 2021-10-31 ENCOUNTER — Encounter: Payer: Self-pay | Admitting: Gastroenterology

## 2021-10-31 ENCOUNTER — Ambulatory Visit: Payer: Medicare Other | Admitting: Gastroenterology

## 2021-10-31 VITALS — HR 87 | Ht 66.0 in | Wt 203.0 lb

## 2021-10-31 DIAGNOSIS — D649 Anemia, unspecified: Secondary | ICD-10-CM

## 2021-10-31 DIAGNOSIS — Z86010 Personal history of colonic polyps: Secondary | ICD-10-CM

## 2021-10-31 DIAGNOSIS — R194 Change in bowel habit: Secondary | ICD-10-CM | POA: Diagnosis not present

## 2021-10-31 DIAGNOSIS — Z800 Family history of malignant neoplasm of digestive organs: Secondary | ICD-10-CM

## 2021-10-31 DIAGNOSIS — Z7901 Long term (current) use of anticoagulants: Secondary | ICD-10-CM

## 2021-10-31 LAB — CBC WITH DIFFERENTIAL/PLATELET
Basophils Absolute: 0 10*3/uL (ref 0.0–0.1)
Basophils Relative: 0.5 % (ref 0.0–3.0)
Eosinophils Absolute: 0 10*3/uL (ref 0.0–0.7)
Eosinophils Relative: 0.9 % (ref 0.0–5.0)
HCT: 35.1 % — ABNORMAL LOW (ref 36.0–46.0)
Hemoglobin: 11.2 g/dL — ABNORMAL LOW (ref 12.0–15.0)
Lymphocytes Relative: 51.4 % — ABNORMAL HIGH (ref 12.0–46.0)
Lymphs Abs: 2.4 10*3/uL (ref 0.7–4.0)
MCHC: 32 g/dL (ref 30.0–36.0)
MCV: 89.5 fl (ref 78.0–100.0)
Monocytes Absolute: 0.4 10*3/uL (ref 0.1–1.0)
Monocytes Relative: 8.7 % (ref 3.0–12.0)
Neutro Abs: 1.8 10*3/uL (ref 1.4–7.7)
Neutrophils Relative %: 38.5 % — ABNORMAL LOW (ref 43.0–77.0)
Platelets: 166 10*3/uL (ref 150.0–400.0)
RBC: 3.92 Mil/uL (ref 3.87–5.11)
RDW: 15.3 % (ref 11.5–15.5)
WBC: 4.6 10*3/uL (ref 4.0–10.5)

## 2021-10-31 LAB — VITAMIN B12: Vitamin B-12: 945 pg/mL — ABNORMAL HIGH (ref 211–911)

## 2021-10-31 LAB — IBC + FERRITIN
Ferritin: 132.5 ng/mL (ref 10.0–291.0)
Iron: 69 ug/dL (ref 42–145)
Saturation Ratios: 18.7 % — ABNORMAL LOW (ref 20.0–50.0)
TIBC: 368.2 ug/dL (ref 250.0–450.0)
Transferrin: 263 mg/dL (ref 212.0–360.0)

## 2021-10-31 LAB — TSH: TSH: 1.58 u[IU]/mL (ref 0.35–5.50)

## 2021-10-31 MED ORDER — FIBER CHOICE 1.5 G PO CHEW: CHEWABLE_TABLET | ORAL | 0 refills | Status: AC

## 2021-10-31 MED ORDER — SUTAB 1479-225-188 MG PO TABS: 1.0000 | ORAL_TABLET | ORAL | 0 refills | Status: DC

## 2021-10-31 NOTE — Patient Instructions (Addendum)
If you are age 75 or older, your body mass index should be between 23-30. Your Body mass index is 32.77 kg/m. If this is out of the aforementioned range listed, please consider follow up with your Primary Care Provider.  If you are age 46 or younger, your body mass index should be between 19-25. Your Body mass index is 32.77 kg/m. If this is out of the aformentioned range listed, please consider follow up with your Primary Care Provider.   ________________________________________________________  The [facility] providers would like to encourage you to use [facility] to communicate with providers for non-urgent requests or questions.  Due to long hold times on the telephone, sending your provider a message by [facility] may be a faster and more efficient way to get a response.  Please allow 48 business hours for a response.  Please remember that this is for non-urgent requests.  _______________________________________________________   Dennis Bast have been scheduled for a colonoscopy. Please follow written instructions given to you at your visit today.  Please pick up your prep supplies at the pharmacy within the next 1-3 days. If you use inhalers (even only as needed), please bring them with you on the day of your procedure.   You will be contacted by our office prior to your procedure for directions on holding your XARELTO.  If you do not hear from our office 1 week prior to your scheduled procedure, please call [phone] to discuss.    Please go to the lab in the basement of our building to have lab work done as you leave today. Hit "B" for basement when you get on the elevator.  When the doors open the lab is on your left.  We will call you with the results. Thank you.  We have given you samples of the following medication to take: Fiber Choice Chews- use as directed   Thank you for entrusting me with your care and for choosing [facility], Dr.  Cellar

## 2021-10-31 NOTE — Progress Notes (Signed)
HPI :  76 year old female with a history of DVT on Xarelto, history of colon polyps, strong family history of colon cancer, history of breast cancer in remission, here to reestablish care to discuss a surveillance colonoscopy, she was last seen in September 2019.  Recall on her last colonoscopy she had a adenomas removed in September 2019.  She has a strong family history of colon cancer, her father had rectal cancer at age 62s, her grandmother and has multiple cousins with colon cancer as well.  She has some altered stool form that varies between somewhat looser and normal formed stools.  Often when she eats out of the house that can sometimes be more prominent.  No blood in the stools.  She had COVID in August 2021 states she has chronic fatigue since that time.  She was diagnosed with a DVT in the setting of tamoxifen use this past June.  Has been on Xarelto, last ultrasound in December showed some persistent clot in the popliteal vein.  She is followed by hematology at [facility], Dr. Carolynn Sayers. We discussed if she wanted to have any further surveillance colonoscopy.  Of note, this past December 8 she had labs at [facility] showing a new anemia with a hemoglobin of 10.7 and an MCV of 91.  Prior comparison hemoglobin in June 2022 at the time of start of anticoagulation showed a normal hemoglobin.  No iron studies on file.  Again she denies any blood in her stools at all.  Of note given her bilateral lymphedema and her arms we did not take blood pressure for her today in the office.  Colonoscopy 06/30/2018 - The perianal and digital rectal examinations were normal. - Two sessile polyps were found in the cecum. The polyps were 2 to 4 mm in size. These polyps were removed with a cold snare. Resection and retrieval were complete. - Two sessile polyps were found in the ascending colon. The polyps were 2 to 6 mm in size. These polyps were removed with a cold snare. Resection and retrieval were complete. - Three  sessile polyps were found in the transverse colon. The polyps were 3 to 7 mm in size. These polyps were removed with a cold snare. Resection and retrieval were complete. - A diminutive polyp was found in the transverse colon. The polyp was sessile. The polyp was removed with a cold biopsy forceps. Resection and retrieval were complete. - Multiple medium-mouthed diverticula were found in the transverse colon and left colon. - Internal hemorrhoids were found during retroflexion. - The exam was otherwise without abnormality.  Pathology shows 8 adenomas  Repeat in 3 years  Echocardiogram 12/12/2016 - EF 60-65%  [place] lower leg 04/14/21 -  IMPRESSION: Positive for deep venous thrombosis in the left lower extremity. Evidence for occlusive thrombus in the left popliteal vein. There is also thrombus involving a left peroneal vein.  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, f/u US 09/26/21 showed old clot    Past Medical History:  Diagnosis Date   Acute deep vein thrombosis (DVT) of popliteal vein of left lower extremity ([place]) 07/2021   on xarelto   Allergy    Anemia    Arthritis    Breast cancer ([place]) 2019   right side   Cancer [facility]) 1997   left breast cancer/last chemo 01/1997/Radiation June 1998   Chemotherapy-induced peripheral neuropathy (HCC)    Colon polyps    Gallstones  Personal history of chemotherapy    Personal history of radiation therapy    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   Pneumonia    Swollen lymph nodes    21 lymph nodes removed left side under armpit     Past Surgical History:  Procedure Laterality Date   ABDOMINAL HYSTERECTOMY  1988   bi lateral mastectomy  10/13/2018   BREAST LUMPECTOMY Left 1998   radiation and chemo   BREAST SURGERY  1998   left breast   CHOLECYSTECTOMY  2008   COLONOSCOPY     TONSILLECTOMY  1975   WRIST FRACTURE SURGERY Right 03/11/2017    Family History  Problem Relation Age of Onset   COPD Mother    Rectal cancer Father 64   Hypertension Sister    Sarcoidosis Sister    High Cholesterol Sister    Hypertension Sister    High Cholesterol Sister    Hypertension Brother    High Cholesterol Brother    Heart attack Brother    Stroke Brother    Colon cancer Paternal Grandmother    Asthma Daughter    Esophageal cancer Paternal Uncle    Stomach cancer Neg Hx    Social History   Tobacco Use   Smoking status: Never   Smokeless tobacco: Never  Vaping Use   Vaping Use: Never used  Substance Use Topics   Alcohol use: No   Drug use: Never   Current Outpatient Medications  Medication Sig Dispense Refill   acetaminophen (TYLENOL) 325 MG tablet Take 2 tablets (650 mg total) by mouth every 4 (four) hours as needed for headache or mild pain. (Patient taking differently: Take 650 mg by mouth as needed for mild pain or headache.) 30 tablet 0   amLODipine (NORVASC) 5 MG tablet Take 5 mg by mouth daily.     Ascorbic Acid (VITAMIN C) 1000 MG tablet Take by mouth.     b complex vitamins capsule Take 1 capsule by mouth daily.     BETA CAROTENE PO Take by mouth daily.     CALCIUM & MAGNESIUM CARBONATES PO Take by mouth. Has Zinc/Ca/Mag -Take 3 pills daily     cephALEXin (KEFLEX) 500 MG capsule Take 500 mg by mouth as needed (to prevent cellutitis).     Cholecalciferol (VITAMIN D3) 1000 units CAPS Take by mouth.     COD LIVER OIL PO Take by mouth.     fluticasone (FLONASE) 50 MCG/ACT nasal spray as needed.      furosemide (LASIX) 40 MG tablet Take 40 mg by mouth daily as needed for fluid.     meloxicam (MOBIC) 15 MG tablet Take 7.5-15 mg by mouth daily.     Misc. Devices MISC Left arm-uses for lipedema     Prenatal Vit-Fe Fumarate-FA (PRENATAL PO) Take by mouth daily.     pseudoephedrine-acetaminophen (TYLENOL SINUS) 30-500 MG TABS tablet Take 1 tablet by mouth as needed.      tamoxifen (NOLVADEX) 20 MG tablet Take 1 tablet by  mouth daily.     vitamin E 400 UNIT capsule Take by mouth.     XARELTO 20 MG TABS tablet Take 20 mg by mouth 2 (two) times daily.     No current facility-administered medications for this visit.   Allergies  Allergen Reactions   Codeine Nausea And Vomiting    unknown     Review of Systems: All systems reviewed and negative except where noted in HPI.  Physical Exam: Pulse 87    Ht 5\' 6"  (1.676 m)    Wt 203 lb (92.1 kg)    SpO2 99%    BMI 32.77 kg/m  Constitutional: Pleasant,well-developed, female in no acute distress. HEENT: Normocephalic and atraumatic. No scleral icterus. Neck supple.  Cardiovascular: Normal rate, regular rhythm.  Pulmonary/chest: Effort normal and breath sounds normal.  Abdominal: Soft, nondistended, nontender. . There are no masses palpable.  Extremities: no edema Lymphadenopathy: No cervical adenopathy noted. Neurological: Alert and oriented to person place and time. Skin: Skin is warm and dry. No rashes noted. Psychiatric: Normal mood and affect. Behavior is normal.   ASSESSMENT AND PLAN: 76 year old female here to reestablish care for the following:  History of colon polyps Family history of colon cancer Anemia Altered bowel habits Anticoagulated  Very strong family history of colon cancer along with 8 adenomas removed over 3 years ago, she is due for surveillance colonoscopy.  I discussed risk benefits of colonoscopy and anesthesia with her and she wants to proceed.  I will need approval from her hematologist to hold her anticoagulation, Xarelto, for 2 days preprocedure.  Will seek request from Dr. Carolynn Sayers, especially in light of her recent ultrasound which showed some persistent clot there.  Patient states she was verbally told she could hold Xarelto but will confirm.  Otherwise she has anemia noted incidentally on her most recent labs.  We will repeat CBC along with iron studies, B12, folate to further evaluate this.  In the interim in light of  her altered stool form, recommend daily fiber supplement to see if that will help provide some regularity.  Gave her some free samples of Fiber Choice today  Plan: - colonoscopy to be scheduled at the [facility] - will need to take BP via leg cuff - approval to hold Xarelto pre-procedure per her Hematologist / Oncologist - CBC, TIBC / ferritin panel, B12, TSH today - daily fiber supplement  Jolly Mango, MD [facility]  CC: Christain Sacramento, MD

## 2021-11-01 ENCOUNTER — Telehealth: Payer: Self-pay

## 2021-11-01 NOTE — Telephone Encounter (Signed)
[facility] [address] [place], [place]  [address] Phone:  [phone]   Fax:  [phone]     10/31/2021     RE:      [name] DOB:   12/12/1945 MRN:   [id number]   Dear Dr. Carolynn Sayers:   We have scheduled the above named patient for a(n) colonoscopy procedure. Our records show that (s)he is on anticoagulation therapy.   Please advise as to whether the patient may come off their therapy of XARELTO 2 days prior to their procedure which is scheduled for 11-08-21, next Wednesday.   Please route your response to Lemar Lofty, CMA or fax response to [phone]. Thank you for your prompt reply due to the nearness of her procedure.   Sincerely,

## 2021-11-01 NOTE — Telephone Encounter (Signed)
Letter faxed to Dr. Velvet Bathe office at [phone].  Received faxed response : Ok to hold Xarelto 2 days prior to procedure" from Dr. Carolynn Sayers.  Called patient and left message regarding approval to hold Xarelto on Monday 1-16 and Tuesday, 1-17. Asked that she call back to confirm she got the message and understands to hold medication for those 2 days prior to procedure on 1-18.

## 2021-11-02 NOTE — Telephone Encounter (Signed)
Patient called back and confirmed understanding

## 2021-11-02 NOTE — Telephone Encounter (Signed)
Left another message at patient's cell phone number.  Home phone is busy and can not leave a message.

## 2021-11-07 ENCOUNTER — Encounter: Payer: Self-pay | Admitting: Certified Registered Nurse Anesthetist

## 2021-11-08 ENCOUNTER — Other Ambulatory Visit: Payer: Self-pay

## 2021-11-08 ENCOUNTER — Encounter: Payer: Self-pay | Admitting: Gastroenterology

## 2021-11-08 ENCOUNTER — Ambulatory Visit (AMBULATORY_SURGERY_CENTER): Payer: Medicare PPO | Admitting: Gastroenterology

## 2021-11-08 VITALS — BP 111/58 | HR 68 | Temp 97.5°F | Resp 15 | Ht 66.0 in | Wt 203.0 lb

## 2021-11-08 DIAGNOSIS — D120 Benign neoplasm of cecum: Secondary | ICD-10-CM | POA: Diagnosis not present

## 2021-11-08 DIAGNOSIS — Z86010 Personal history of colonic polyps: Secondary | ICD-10-CM

## 2021-11-08 DIAGNOSIS — D125 Benign neoplasm of sigmoid colon: Secondary | ICD-10-CM

## 2021-11-08 MED ORDER — SODIUM CHLORIDE 0.9 % IV SOLN: 500.0000 mL | Freq: Once | INTRAVENOUS | Status: DC

## 2021-11-08 NOTE — Op Note (Signed)
[place] [facility] Patient Name: [name] Procedure Date: 11/08/2021 8:05 AM MRN: [id number] Endoscopist: Remo Lipps P. Havery Moros , MD Age: 76 Referring MD:  Date of Birth: August 14, 1946 Gender: Female Account #: [id number] Procedure:                Colonoscopy Indications:              High risk colon cancer surveillance: Personal                            history of colonic polyps - 8 adenomas removed                            06/2018, father with rectal cancer dx age 13s Medicines:                Monitored Anesthesia Care Procedure:                Pre-Anesthesia Assessment:                           - Prior to the procedure, a History and Physical                            was performed, and patient medications and                            allergies were reviewed. The patient's tolerance of                            previous anesthesia was also reviewed. The risks                            and benefits of the procedure and the sedation                            options and risks were discussed with the patient.                            All questions were answered, and informed consent                            was obtained. Prior Anticoagulants: The patient has                            taken Xarelto (rivaroxaban), last dose was 2 days                            prior to procedure. ASA Grade Assessment: II - A                            patient with mild systemic disease. After reviewing                            the risks and benefits, the patient was deemed in  satisfactory condition to undergo the procedure.                           After obtaining informed consent, the colonoscope                            was passed under direct vision. Throughout the                            procedure, the patient's blood pressure, pulse, and                            oxygen saturations were monitored continuously. The                             PCF-HQ190L Colonoscope was introduced through the                            anus and advanced to the the cecum, identified by                            appendiceal orifice and ileocecal valve. The                            colonoscopy was performed without difficulty. The                            patient tolerated the procedure well. The quality                            of the bowel preparation was good. The ileocecal                            valve, appendiceal orifice, and rectum were                            photographed. Scope In: 8:11:28 AM Scope Out: 8:30:52 AM Scope Withdrawal Time: 0 hours 16 minutes 10 seconds  Total Procedure Duration: 0 hours 19 minutes 24 seconds  Findings:                 The perianal and digital rectal examinations were                            normal.                           Two sessile polyps were found in the cecum. The                            polyps were 2 mm in size. These polyps were removed                            with a cold snare. Resection and retrieval were  complete.                           Multiple small-mouthed diverticula were found in                            the entire colon.                           A 3 mm polyp was found in the sigmoid colon. The                            polyp was sessile. The polyp was removed with a                            cold snare. Resection and retrieval were complete.                           Internal hemorrhoids were found during retroflexion.                           The exam was otherwise without abnormality. Complications:            No immediate complications. Estimated blood loss:                            Minimal. Estimated Blood Loss:     Estimated blood loss was minimal. Impression:               - Two 2 mm polyps in the cecum, removed with a cold                            snare. Resected and retrieved.                           - Diverticulosis  in the entire examined colon.                           - One 3 mm polyp in the sigmoid colon, removed with                            a cold snare. Resected and retrieved.                           - Internal hemorrhoids.                           - The examination was otherwise normal. Recommendation:           - Patient has a contact number available for                            emergencies. The signs and symptoms of potential                            delayed complications were discussed with  the                            patient. Return to normal activities tomorrow.                            Written discharge instructions were provided to the                            patient.                           - Resume previous diet.                           - Continue present medications.                           - Resume Xarelto tomorrow morning                           - Await pathology results, anticipate repeat                            colonoscopy in 5 years for surveillance Remo Lipps P. Jessy Cybulski, MD 11/08/2021 8:35:38 AM This report has been signed electronically.

## 2021-11-08 NOTE — Patient Instructions (Addendum)
RESUME XARELTO TOMORROW MORNING   HANDOUT ON POLYPS GIVEN TO YOU TODAY  AWAIT PATHOLOGY RESULTS   YOU HAD AN ENDOSCOPIC PROCEDURE TODAY AT [facility]:   Refer to the procedure report that was given to you for any specific questions about what was found during the examination.  If the procedure report does not answer your questions, please call your gastroenterologist to clarify.  If you requested that your care partner not be given the details of your procedure findings, then the procedure report has been included in a sealed envelope for you to review at your convenience later.  YOU SHOULD EXPECT: Some feelings of bloating in the abdomen. Passage of more gas than usual.  Walking can help get rid of the air that was put into your GI tract during the procedure and reduce the bloating. If you had a lower endoscopy (such as a colonoscopy or flexible sigmoidoscopy) you may notice spotting of blood in your stool or on the toilet paper. If you underwent a bowel prep for your procedure, you may not have a normal bowel movement for a few days.  Please Note:  You might notice some irritation and congestion in your nose or some drainage.  This is from the oxygen used during your procedure.  There is no need for concern and it should clear up in a day or so.  SYMPTOMS TO REPORT IMMEDIATELY:  Following lower endoscopy (colonoscopy or flexible sigmoidoscopy):  Excessive amounts of blood in the stool  Significant tenderness or worsening of abdominal pains  Swelling of the abdomen that is new, acute  Fever of 100F or higher   For urgent or emergent issues, a gastroenterologist can be reached at any hour by calling [phone]. Do not use MyChart messaging for urgent concerns.    DIET:  We do recommend a small meal at first, but then you may proceed to your regular diet.  Drink plenty of fluids but you should avoid alcoholic beverages for 24 hours.  ACTIVITY:  You should plan  to take it easy for the rest of today and you should NOT DRIVE or use heavy machinery until tomorrow (because of the sedation medicines used during the test).    FOLLOW UP: Our staff will call the number listed on your records 48-72 hours following your procedure to check on you and address any questions or concerns that you may have regarding the information given to you following your procedure. If we do not reach you, we will leave a message.  We will attempt to reach you two times.  During this call, we will ask if you have developed any symptoms of COVID 19. If you develop any symptoms (ie: fever, flu-like symptoms, shortness of breath, cough etc.) before then, please call [phone].  If you test positive for Covid 19 in the 2 weeks post procedure, please call and report this information to [place].    If any biopsies were taken you will be contacted by phone or by letter within the next 1-3 weeks.  Please call us at [phone] if you have not heard about the biopsies in 3 weeks.    SIGNATURES/CONFIDENTIALITY: You and/or your care partner have signed paperwork which will be entered into your electronic medical record.  These signatures attest to the fact that that the information above on your After Visit Summary has been reviewed and is understood.  Full responsibility of the confidentiality of this discharge information lies with you and/or  your care-partner. TO TOMORROW MORNING  AWAIT PATHOLOGY RESULTS ON POLYPS REMOVED   HANDOUT ON POLYPS & DIVERTICULOSIS GIVEN TO YOU TODAY

## 2021-11-08 NOTE — Progress Notes (Signed)
Called to room to assist during endoscopic procedure.  Patient ID and intended procedure confirmed with present staff. Received instructions for my participation in the procedure from the performing physician.  

## 2021-11-08 NOTE — Progress Notes (Signed)
VS by CW  Pt's states no medical or surgical changes since previsit or office visit.  

## 2021-11-08 NOTE — Progress Notes (Signed)
History and Physical Interval Note: seen on 10/31/21 - history of colon polyps, 8 adenomas removed 06/2018, strong FH of colon cancer, here for surveillance colonoscopy. No interval changes since I have seen her. She has held Xarelto for 2 days for this exam. She agrees with the plan, all questions answered, she wishes to proceed.  11/08/2021 8:06 AM  [name]  has presented today for endoscopic procedure(s), with the diagnosis of  Encounter Diagnosis  Name Primary?   History of colon polyps Yes  .  The various methods of evaluation and treatment have been discussed with the patient and/or family. After consideration of risks, benefits and other options for treatment, the patient has consented to  the endoscopic procedure(s).   The patient's history has been reviewed, patient examined, no change in status, stable for surgery.  I have reviewed the patient's chart and labs.  Questions were answered to the patient's satisfaction.    Jolly Mango, MD [facility]

## 2021-11-08 NOTE — Progress Notes (Signed)
Report given to PACU, vss 

## 2021-11-10 ENCOUNTER — Telehealth: Payer: Self-pay | Admitting: *Deleted

## 2021-11-10 NOTE — Telephone Encounter (Signed)
°  Follow up Call-  Call back number 11/08/2021  Post procedure Call Back phone  # [phone]  Permission to leave phone message Yes  Some recent data might be hidden     Patient questions:  Do you have a fever, pain , or abdominal swelling? No. Pain Score  0 *  Have you tolerated food without any problems? Yes.    Have you been able to return to your normal activities? Yes.    Do you have any questions about your discharge instructions: Diet   No. Medications  No. Follow up visit  No.  Do you have questions or concerns about your Care? No.  Actions: * If pain score is 4 or above: No action needed, pain <4.  Have you developed a fever since your procedure? no  2.   Have you had an respiratory symptoms (SOB or cough) since your procedure? no  3.   Have you tested positive for COVID 19 since your procedure no  4.   Have you had any family members/close contacts diagnosed with the COVID 19 since your procedure?  no   If yes to any of these questions please route to Joylene John, RN and Joella Prince, RN

## 2022-12-11 ENCOUNTER — Ambulatory Visit (INDEPENDENT_AMBULATORY_CARE_PROVIDER_SITE_OTHER): Payer: Medicare Other | Admitting: Ophthalmology

## 2022-12-11 ENCOUNTER — Encounter (INDEPENDENT_AMBULATORY_CARE_PROVIDER_SITE_OTHER): Payer: Self-pay | Admitting: Ophthalmology

## 2022-12-11 DIAGNOSIS — H33322 Round hole, left eye: Secondary | ICD-10-CM

## 2022-12-11 DIAGNOSIS — H35033 Hypertensive retinopathy, bilateral: Secondary | ICD-10-CM | POA: Diagnosis not present

## 2022-12-11 DIAGNOSIS — I10 Essential (primary) hypertension: Secondary | ICD-10-CM | POA: Diagnosis not present

## 2022-12-11 DIAGNOSIS — Z961 Presence of intraocular lens: Secondary | ICD-10-CM

## 2022-12-11 MED ORDER — PREDNISOLONE ACETATE 1 % OP SUSP: 1.0000 [drp] | Freq: Four times a day (QID) | OPHTHALMIC | 0 refills | Status: AC

## 2022-12-11 NOTE — Progress Notes (Signed)
[facility] Note  12/11/2022     CHIEF COMPLAINT Patient presents for Retina Evaluation   HISTORY OF PRESENT ILLNESS: [name] is a 77 y.o. female who presents to the clinic today for:   HPI     Retina Evaluation   In left eye.  This started 1 day ago.  Duration of 1 day.  I, the attending physician,  performed the HPI with the patient and updated documentation appropriately.        Comments   Retina eval per Dr Lucianne Lei for retina tear in OS pt denies any vision changes or flashes or floaters       Last edited by Bernarda Caffey, MD on 12/11/2022 12:02 PM.    Pt here on the referral of Dr. Lucianne Lei for concern of retinal hole OS, pt saw her this morning for a routine exam, pt denies fol or floaters, pt had cataract sx a couple years ago with Dr. Kathlen Mody, pt takes medication for hypertension, pt also has a hx of breast cancer and is on tamoxifen   Referring physician: Lisabeth Pick, MD [place],  [place] [address]  HISTORICAL INFORMATION:   Selected notes from the MEDICAL RECORD NUMBER Referred by Dr. Lucianne Lei for concern of retinal hole LEE:  Ocular Hx- PMH-    CURRENT MEDICATIONS: Current Outpatient Medications (Ophthalmic Drugs)  Medication Sig   prednisoLONE acetate (PRED FORTE) 1 % ophthalmic suspension Place 1 drop into the left eye 4 (four) times daily for 7 days.   cycloSPORINE (RESTASIS) 0.05 % ophthalmic emulsion Place one drop into both eyes 2 (two) times daily.   No current facility-administered medications for this visit. (Ophthalmic Drugs)   Current Outpatient Medications (Other)  Medication Sig   acetaminophen (TYLENOL) 325 MG tablet Take 2 tablets (650 mg total) by mouth every 4 (four) hours as needed for headache or mild pain. (Patient taking differently: Take 650 mg by mouth as needed for mild pain or headache.)   albuterol (VENTOLIN HFA) 108 (90 Base) MCG/ACT inhaler Inhale into the lungs.   amLODipine  (NORVASC) 5 MG tablet Take 5 mg by mouth daily.   Ascorbic Acid (VITAMIN C) 1000 MG tablet Take by mouth.   b complex vitamins capsule Take 1 capsule by mouth daily.   BETA CAROTENE PO Take by mouth daily.   CALCIUM & MAGNESIUM CARBONATES PO Take by mouth. Has Zinc/Ca/Mag -Take 3 pills daily   cephALEXin (KEFLEX) 500 MG capsule Take 500 mg by mouth as needed (to prevent cellutitis).   Cholecalciferol (VITAMIN D3) 1000 units CAPS Take by mouth.   COD LIVER OIL PO Take by mouth.   fluticasone (FLONASE) 50 MCG/ACT nasal spray as needed.    furosemide (LASIX) 40 MG tablet Take 40 mg by mouth daily as needed for fluid.   Inulin (FIBER CHOICE) 1.5 g CHEW Take as directed   meloxicam (MOBIC) 15 MG tablet Take 7.5-15 mg by mouth daily.   Misc. Devices MISC Left arm-uses for lipedema   Prenatal Vit-Fe Fumarate-FA (PRENATAL PO) Take by mouth daily.   pseudoephedrine-acetaminophen (TYLENOL SINUS) 30-500 MG TABS tablet Take 1 tablet by mouth as needed.    tamoxifen (NOLVADEX) 20 MG tablet Take 1 tablet by mouth daily.   vitamin E 400 UNIT capsule Take by mouth.   XARELTO 20 MG TABS tablet Take 20 mg by mouth 2 (two) times daily.   No current facility-administered medications for this visit. (Other)   REVIEW  OF SYSTEMS: ROS   Positive for: Eyes Last edited by Parthenia Ames, COT on 12/11/2022 10:17 AM.     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   Pneumonia    Swollen lymph nodes    21 lymph nodes removed left side under armpit   Past Surgical History:  Procedure Laterality Date   ABDOMINAL HYSTERECTOMY  1988   bi lateral mastectomy  10/13/2018   BREAST LUMPECTOMY Left 1998   radiation and chemo   BREAST SURGERY  1998   left breast   CHOLECYSTECTOMY  2008   COLONOSCOPY     TONSILLECTOMY  1975   WRIST FRACTURE SURGERY Right 03/11/2017   FAMILY HISTORY Family History  Problem Relation Age of Onset   COPD Mother    Rectal cancer Father 90   Hypertension Sister    Sarcoidosis Sister    High Cholesterol Sister    Hypertension Sister    High Cholesterol Sister    Hypertension Brother    High Cholesterol Brother    Heart attack Brother    Stroke Brother    Colon cancer Paternal Grandmother    Asthma Daughter    Esophageal cancer Paternal Uncle    Stomach cancer Neg Hx    SOCIAL HISTORY Social History   Tobacco Use   Smoking status: Never   Smokeless tobacco: Never  Vaping Use   Vaping Use: Never used  Substance Use Topics   Alcohol use: No   Drug use: Never         OPHTHALMIC EXAM:  Base Eye Exam     Visual Acuity (Snellen - Linear)       Right Left   Dist cc 20/20 -1 20/25    Correction: Glasses         Tonometry (Tonopen, 10:19 AM)       Right Left   Pressure 14 14         Pupils       Dark   Right dilated   Left dilated         Visual Fields       Left Right    Full Full         Extraocular Movement       Right Left    Full, Ortho Full, Ortho         Dilation     Both eyes: 2.5% Phenylephrine @ 10:19 AM  Dilated at Dr Lucianne Lei           Slit Lamp and Fundus Exam     Slit Lamp Exam       Right Left   Lids/Lashes Dermatochalasis - upper lid Dermatochalasis - upper lid   Conjunctiva/Sclera mild melanosis, mild nasal and temporal pinguecula mild melanosis, mild nasal and temporal pinguecula  Cornea well healed  cataract wound, tear film debris 1+ Punctate epithelial erosions, well healed cataract wound   Anterior Chamber deep and clear deep and clear   Iris Round and dilated Round and dilated   Lens PC IOL in good position PC IOL in good position   Anterior Vitreous mild syneresis mild syneresis, no pigment         Fundus Exam       Right Left   Disc Pink and Sharp, Compact, mild PPA mild Pallor, Sharp rim   C/D Ratio 0.2 0.3   Macula Flat, Good foveal reflex, RPE mottling, No heme or edema Flat, Good foveal reflex, RPE mottling, No heme or edema   Vessels mild attenuation, mild tortuosity mild attenuation, mild tortuosity   Periphery Attached, mild peripheral drusen, No heme Attached, large peripheral retinal hole from 4-5 oclock with surrounding CR scarring, no SRF; mild peripheral drusen            IMAGING AND PROCEDURES  Imaging and Procedures for 12/11/2022  OCT, Retina - OU - Both Eyes       Right Eye Quality was good. Central Foveal Thickness: 269. Progression has no prior data. Findings include normal foveal contour, no IRF, no SRF.   Left Eye Quality was good. Central Foveal Thickness: 264. Progression has no prior data. Findings include normal foveal contour, no IRF, no SRF (Rare drusen).   Notes *Images captured and stored on drive  Diagnosis / Impression:  NFP, no IRF/SRF OU Rare drusen OS  Clinical management:  See below  Abbreviations: NFP - Normal foveal profile. CME - cystoid macular edema. PED - pigment epithelial detachment. IRF - intraretinal fluid. SRF - subretinal fluid. EZ - ellipsoid zone. ERM - epiretinal membrane. ORA - outer retinal atrophy. ORT - outer retinal tubulation. SRHM - subretinal hyper-reflective material. IRHM - intraretinal hyper-reflective material      Repair Retinal Breaks, Laser - OS - Left Eye       LASER PROCEDURE NOTE  Procedure:  Barrier laser retinopexy using slit lamp laser, left eye   Diagnosis:   Large peripheral  retinal hole from 0400-0500  Surgeon: Bernarda Caffey, MD, PhD  Anesthesia: Topical  Informed consent obtained, operative eye marked, and time out performed prior to initiation of laser.   Laser settings:  Lumenis [id number] laser, slit lamp Lens: [place]ster PRP 165 Power: 300 mW Spot size: 200 microns Duration: 30 msec  # spots: 269  Placement of laser: Using a Mainster PRP 165 contact lens at the slit lamp, laser was placed in three confluent rows posterior to retinal hole spanning 0400-0500 just posterior to ora. The anterior corners of retinal hole were completed via laser indirect ophthalmoscopy with scleral depression: 160 spots, 290 mW power, 70 ms duration..  Complications: None.  Patient tolerated the procedure well and received written and verbal post-procedure care information/education.            ASSESSMENT/PLAN:    ICD-10-CM   1. Retinal hole of left eye  H33.322 OCT, Retina - OU - Both Eyes    Repair Retinal Breaks, Laser - OS - Left Eye    2. Essential hypertension  I10     3. Hypertensive retinopathy of both eyes  H35.033     4. Pseudophakia, both eyes  Z96.1      Retinal hole, OS - The incidence, risk factors, and natural history of retinal tear was discussed with patient.   - Potential treatment options including laser retinopexy and  cryotherapy discussed with patient. - large peripheral retinal hole spanning 4-5 oclock - incidental finding on routine exam by Dr. Lucianne Lei -- pt asymptomatic - recommend laser retinopexy today OS today, 02.20.24 - pt wishes to proceed with laser - RBA of procedure discussed, questions answered - informed consent obtained and signed - see procedure note - start PF QID x7 days - f/u in 2-3 wks, DFE, OCT  2,3. Hypertensive retinopathy OU - discussed importance of tight BP control - monitor  4. Pseudophakia OU  - s/p CE/IOL OU  - IOL in good position, doing well  - monitor  Ophthalmic Meds Ordered this visit:  Meds  ordered this encounter  Medications   prednisoLONE acetate (PRED FORTE) 1 % ophthalmic suspension    Sig: Place 1 drop into the left eye 4 (four) times daily for 7 days.    Dispense:  5 mL    Refill:  0     Return for 2-3 wks - retinal hole OS - Dilated Exam, OCT.  There are no Patient Instructions on file for this visit.  Explained the diagnoses, plan, and follow up with the patient and they expressed understanding.  Patient expressed understanding of the importance of proper follow up care.   This document serves as a record of services personally performed by Gardiner Sleeper, MD, PhD. It was created on their behalf by San Jetty. Owens Shark, OA an ophthalmic technician. The creation of this record is the provider's dictation and/or activities during the visit.    Electronically signed by: San Jetty. Owens Shark, [place] 02.20.2024 12:10 PM  Gardiner Sleeper, M.D., Ph.D. Diseases & Surgery of the Retina and Vitreous [facility]  I have reviewed the above documentation for accuracy and completeness, and I agree with the above. Gardiner Sleeper, M.D., Ph.D. 12/11/22 12:24 PM   Abbreviations: M myopia (nearsighted); A astigmatism; H hyperopia (farsighted); P presbyopia; Mrx spectacle prescription;  CTL contact lenses; OD right eye; OS left eye; OU both eyes  XT exotropia; ET esotropia; PEK punctate epithelial keratitis; PEE punctate epithelial erosions; DES dry eye syndrome; MGD meibomian gland dysfunction; ATs artificial tears; PFAT's preservative free artificial tears; [place] nuclear sclerotic cataract; PSC posterior subcapsular cataract; ERM epi-retinal membrane; PVD posterior vitreous detachment; RD retinal detachment; DM diabetes mellitus; DR diabetic retinopathy; NPDR non-proliferative diabetic retinopathy; PDR proliferative diabetic retinopathy; CSME clinically significant macular edema; DME diabetic macular edema; dbh dot blot hemorrhages; CWS cotton wool spot; POAG primary open angle  glaucoma; C/D cup-to-disc ratio; HVF humphrey visual field; GVF goldmann visual field; OCT optical coherence tomography; IOP intraocular pressure; BRVO Branch retinal vein occlusion; CRVO central retinal vein occlusion; CRAO central retinal artery occlusion; BRAO branch retinal artery occlusion; RT retinal tear; SB scleral buckle; PPV pars plana vitrectomy; VH Vitreous hemorrhage; PRP panretinal laser photocoagulation; IVK intravitreal kenalog; VMT vitreomacular traction; MH Macular hole;  NVD neovascularization of the disc; NVE neovascularization elsewhere; AREDS age related eye disease study; ARMD age related macular degeneration; POAG primary open angle glaucoma; EBMD epithelial/anterior basement membrane dystrophy; ACIOL anterior chamber intraocular lens; IOL intraocular lens; PCIOL posterior chamber intraocular lens; Phaco/IOL phacoemulsification with intraocular lens placement; [place] photorefractive keratectomy; LASIK laser assisted in situ keratomileusis; HTN hypertension; DM diabetes mellitus; COPD chronic obstructive pulmonary disease

## 2022-12-25 NOTE — Progress Notes (Signed)
[facility]  01/03/2023     CHIEF COMPLAINT Patient presents for Retina Follow Up   HISTORY OF PRESENT ILLNESS: [name] is a 77 y.o. female who presents to the clinic today for:   HPI     Retina Follow Up   Patient presents with  Retinal Break/Detachment.  In left eye.  This started weeks ago.  Duration of 3 weeks.  I, the attending physician,  performed the HPI with the patient and updated documentation appropriately.        Comments   Patient denies noticing any vision changes at this time. She is using Restasis OU BID.       Last edited by Bernarda Caffey, MD on 01/04/2023  2:26 AM.    Pt states her vision is stable, no problems after laser, eyes are itching and watering   Referring physician: Christain Sacramento, MD [address],  [place] [address]  HISTORICAL INFORMATION:   Selected notes from the MEDICAL RECORD NUMBER Referred by Dr. Lucianne Lei for concern of retinal hole LEE:  Ocular Hx- PMH-    CURRENT MEDICATIONS: Current Outpatient Medications (Ophthalmic Drugs)  Medication Sig   cycloSPORINE (RESTASIS) 0.05 % ophthalmic emulsion Place one drop into both eyes 2 (two) times daily.   No current facility-administered medications for this visit. (Ophthalmic Drugs)   Current Outpatient Medications (Other)  Medication Sig   acetaminophen (TYLENOL) 325 MG tablet Take 2 tablets (650 mg total) by mouth every 4 (four) hours as needed for headache or mild pain. (Patient taking differently: Take 650 mg by mouth as needed for mild pain or headache.)   albuterol (VENTOLIN HFA) 108 (90 Base) MCG/ACT inhaler Inhale into the lungs.   amLODipine (NORVASC) 5 MG tablet Take 5 mg by mouth daily.   Ascorbic Acid (VITAMIN C) 1000 MG tablet Take by mouth.   b complex vitamins capsule Take 1 capsule by mouth daily.   BETA CAROTENE PO Take by mouth daily.   CALCIUM & MAGNESIUM CARBONATES PO Take by mouth. Has Zinc/Ca/Mag -Take 3 pills  daily   cephALEXin (KEFLEX) 500 MG capsule Take 500 mg by mouth as needed (to prevent cellutitis).   Cholecalciferol (VITAMIN D3) 1000 units CAPS Take by mouth.   COD LIVER OIL PO Take by mouth.   fluticasone (FLONASE) 50 MCG/ACT nasal spray as needed.    furosemide (LASIX) 40 MG tablet Take 40 mg by mouth daily as needed for fluid.   Inulin (FIBER CHOICE) 1.5 g CHEW Take as directed   meloxicam (MOBIC) 15 MG tablet Take 7.5-15 mg by mouth daily.   Misc. Devices MISC Left arm-uses for lipedema   Prenatal Vit-Fe Fumarate-FA (PRENATAL PO) Take by mouth daily.   pseudoephedrine-acetaminophen (TYLENOL SINUS) 30-500 MG TABS tablet Take 1 tablet by mouth as needed.    tamoxifen (NOLVADEX) 20 MG tablet Take 1 tablet by mouth daily.   vitamin E 400 UNIT capsule Take by mouth.   XARELTO 20 MG TABS tablet Take 20 mg by mouth 2 (two) times daily.   No current facility-administered medications for this visit. (Other)   REVIEW OF SYSTEMS: ROS   Positive for: Eyes Last edited by Annie Paras, COT on 01/03/2023  8:27 AM.      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   Pneumonia    Swollen lymph nodes    21 lymph nodes removed left side under armpit   Past Surgical History:  Procedure Laterality Date   ABDOMINAL HYSTERECTOMY  1988   bi lateral mastectomy  10/13/2018   BREAST LUMPECTOMY Left 1998   radiation  and chemo   BREAST SURGERY  1998   left breast   CHOLECYSTECTOMY  2008   COLONOSCOPY     TONSILLECTOMY  1975   WRIST FRACTURE SURGERY Right 03/11/2017   FAMILY HISTORY Family History  Problem Relation Age of Onset   COPD Mother    Rectal cancer Father 56   Hypertension Sister    Sarcoidosis Sister    High Cholesterol Sister    Hypertension Sister    High Cholesterol Sister    Hypertension Brother    High Cholesterol Brother    Heart attack Brother    Stroke Brother    Colon cancer Paternal Grandmother    Asthma Daughter    Esophageal cancer Paternal Uncle    Stomach cancer Neg Hx    SOCIAL HISTORY Social History   Tobacco Use   Smoking status: Never   Smokeless tobacco: Never  Vaping Use   Vaping Use: Never used  Substance Use Topics   Alcohol use: No   Drug use: Never       OPHTHALMIC EXAM:  Base Eye Exam     Visual Acuity (Snellen - Linear)       Right Left   Dist cc 20/20 20/20         Tonometry (Tonopen, 8:29 AM)       Right Left   Pressure 11 13         Pupils       Dark Light Shape React APD   Right 3 2 Round Minimal None   Left 3 2 Round Minimal None         Visual Fields       Left Right    Full Full         Extraocular Movement       Right Left    Full, Ortho Full, Ortho         Dilation     Both eyes: 1.0% Mydriacyl, 2.5% Phenylephrine @ 8:27 AM           Slit Lamp and Fundus Exam     Slit Lamp Exam       Right Left   Lids/Lashes Dermatochalasis - upper lid Dermatochalasis - upper lid   Conjunctiva/Sclera mild melanosis, mild nasal and temporal pinguecula mild melanosis, mild nasal and temporal pinguecula   Cornea well healed cataract wound, tear film debris Trace Punctate epithelial erosions, well healed cataract wound   Anterior Chamber deep and clear deep and clear   Iris Round and dilated Round and dilated   Lens PC IOL in good position PC IOL in good position   Anterior Vitreous mild syneresis  mild  syneresis, no pigment         Fundus Exam       Right Left   Disc Pink and Sharp, Compact, mild PPA mild Pallor, Sharp rim   C/D Ratio 0.2 0.3   Macula Flat, Good foveal reflex, RPE mottling, No heme or edema Flat, Good foveal reflex, RPE mottling, No heme or edema   Vessels mild attenuation, mild tortuosity mild attenuation, mild tortuosity   Periphery Attached, mild peripheral drusen, No heme Attached, large peripheral retinal hole from 4-5 oclock with surrounding CR scarring, no SRF; mild peripheral drusen, No RT/RD            IMAGING AND PROCEDURES  Imaging and Procedures for 01/03/2023  OCT, Retina - OU - Both Eyes       Right Eye Quality was good. Central Foveal Thickness: 245. Progression has been stable. Findings include normal foveal contour, no IRF, no SRF.   Left Eye Quality was good. Central Foveal Thickness: 261. Progression has been stable. Findings include normal foveal contour, no IRF, no SRF (Rare drusen, trace vitreous opacities).   Notes *Images captured and stored on drive  Diagnosis / Impression:  NFP, no IRF/SRF OU Rare drusen OS, trace vitreous opacities  Clinical management:  See below  Abbreviations: NFP - Normal foveal profile. CME - cystoid macular edema. PED - pigment epithelial detachment. IRF - intraretinal fluid. SRF - subretinal fluid. EZ - ellipsoid zone. ERM - epiretinal membrane. ORA - outer retinal atrophy. ORT - outer retinal tubulation. SRHM - subretinal hyper-reflective material. IRHM - intraretinal hyper-reflective material            ASSESSMENT/PLAN:    ICD-10-CM   1. 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 Essential hypertension  I10     3. Hypertensive retinopathy of both eyes  H35.033 OCT, Retina - OU - Both Eyes    4. Pseudophakia, both eyes  Z96.1      Retinal hole, OS - large peripheral retinal hole spanning 4-5 oclock - incidental finding on routine exam by Dr. Lucianne Lei -- pt asymptomatic - s/p laser  retinopexy OS (02.20.24) -- good laser changes in place - no new RT/RD - f/u in 2 months, DFE, OCT  2,3. Hypertensive retinopathy OU - discussed importance of tight BP control - monitor  4. Pseudophakia OU  - s/p CE/IOL OU  - IOL in good position, doing well  - monitor  Ophthalmic Meds Ordered this visit:  No orders of the defined types were placed in this encounter.    Return in about 2 months (around 03/05/2023) for f/u retinal hole OS, DFE, OCT.  There are no Patient Instructions on file for this visit.  Explained the diagnoses, plan, and follow up with the patient and they expressed understanding.  Patient expressed understanding of the importance of proper follow up care.   This document serves as a record of services personally performed by Gardiner Sleeper, MD, PhD. It was created on their behalf by San Jetty. Owens Shark, OA an ophthalmic technician. The creation of this record is the provider's dictation and/or activities during the visit.    Electronically signed by: San Jetty. Owens Shark, [place] 03.05.2024 2:30 AM  Gardiner Sleeper, M.D., Ph.D. Diseases & Surgery of the Retina and Vitreous [facility]  I have reviewed the above documentation for accuracy and completeness, and I agree with the above. Gardiner Sleeper, M.D., Ph.D. 01/04/23 2:31 AM  Abbreviations: Jerilynn Mages myopia (  nearsighted); A astigmatism; H hyperopia (farsighted); P presbyopia; Mrx spectacle prescription;  CTL contact lenses; OD right eye; OS left eye; OU both eyes  XT exotropia; ET esotropia; PEK punctate epithelial keratitis; PEE punctate epithelial erosions; DES dry eye syndrome; MGD meibomian gland dysfunction; ATs artificial tears; PFAT's preservative free artificial tears; [place] nuclear sclerotic cataract; PSC posterior subcapsular cataract; ERM epi-retinal membrane; PVD posterior vitreous detachment; RD retinal detachment; DM diabetes mellitus; DR diabetic retinopathy; NPDR non-proliferative diabetic  retinopathy; PDR proliferative diabetic retinopathy; CSME clinically significant macular edema; DME diabetic macular edema; dbh dot blot hemorrhages; CWS cotton wool spot; POAG primary open angle glaucoma; C/D cup-to-disc ratio; HVF humphrey visual field; GVF goldmann visual field; OCT optical coherence tomography; IOP intraocular pressure; BRVO Branch retinal vein occlusion; CRVO central retinal vein occlusion; CRAO central retinal artery occlusion; BRAO branch retinal artery occlusion; RT retinal tear; SB scleral buckle; PPV pars plana vitrectomy; VH Vitreous hemorrhage; PRP panretinal laser photocoagulation; IVK intravitreal kenalog; VMT vitreomacular traction; MH Macular hole;  NVD neovascularization of the disc; NVE neovascularization elsewhere; AREDS age related eye disease study; ARMD age related macular degeneration; POAG primary open angle glaucoma; EBMD epithelial/anterior basement membrane dystrophy; ACIOL anterior chamber intraocular lens; IOL intraocular lens; PCIOL posterior chamber intraocular lens; Phaco/IOL phacoemulsification with intraocular lens placement; [place] photorefractive keratectomy; LASIK laser assisted in situ keratomileusis; HTN hypertension; DM diabetes mellitus; COPD chronic obstructive pulmonary disease

## 2023-01-03 ENCOUNTER — Ambulatory Visit (INDEPENDENT_AMBULATORY_CARE_PROVIDER_SITE_OTHER): Payer: Medicare Other | Admitting: Ophthalmology

## 2023-01-03 DIAGNOSIS — Z961 Presence of intraocular lens: Secondary | ICD-10-CM

## 2023-01-03 DIAGNOSIS — H35033 Hypertensive retinopathy, bilateral: Secondary | ICD-10-CM | POA: Diagnosis not present

## 2023-01-03 DIAGNOSIS — I10 Essential (primary) hypertension: Secondary | ICD-10-CM | POA: Diagnosis not present

## 2023-01-03 DIAGNOSIS — H33322 Round hole, left eye: Secondary | ICD-10-CM | POA: Diagnosis not present

## 2023-01-04 ENCOUNTER — Encounter (INDEPENDENT_AMBULATORY_CARE_PROVIDER_SITE_OTHER): Payer: Self-pay | Admitting: Ophthalmology

## 2023-02-21 NOTE — Progress Notes (Signed)
[facility] - Clinic Note  03/07/2023     CHIEF COMPLAINT Patient presents for Retina Follow Up   HISTORY OF PRESENT ILLNESS: [name] is a 77 y.o. female who presents to the clinic today for:   HPI     Retina Follow Up   Patient presents with  Retinal Break/Detachment.  In left eye.  Severity is moderate.  Duration of 2 months.  Since onset it is stable.  I, the attending physician,  performed the HPI with the patient and updated documentation appropriately.        Comments   Pt here for 2 mo ret f/u ret hole OS. Pt states VA is unchanged. Having some burning OU when she uses gtts (Restasis, [place] A and another dry eye drop).      Last edited by Thompson Grayer, COT on 03/07/2023  7:45 AM.    Pt states her eyes burned after the laser procedure, she used PF for a week as directed   Referring physician: Barbie Banner, MD [address]  [place] [address]  HISTORICAL INFORMATION:   Selected notes from the MEDICAL RECORD NUMBER Referred by Dr. Zenaida Niece for concern of retinal hole LEE:  Ocular Hx- PMH-    CURRENT MEDICATIONS: Current Outpatient Medications (Ophthalmic Drugs)  Medication Sig   cycloSPORINE (RESTASIS) 0.05 % ophthalmic emulsion Place one drop into both eyes 2 (two) times daily.   No current facility-administered medications for this visit. (Ophthalmic Drugs)   Current Outpatient Medications (Other)  Medication Sig   acetaminophen (TYLENOL) 325 MG tablet Take 2 tablets (650 mg total) by mouth every 4 (four) hours as needed for headache or mild pain. (Patient taking differently: Take 650 mg by mouth as needed for mild pain or headache.)   albuterol (VENTOLIN HFA) 108 (90 Base) MCG/ACT inhaler Inhale into the lungs.   amLODipine (NORVASC) 5 MG tablet Take 5 mg by mouth daily.   Ascorbic Acid (VITAMIN C) 1000 MG tablet Take by mouth.   b complex vitamins capsule Take 1 capsule by mouth daily.   BETA CAROTENE PO  Take by mouth daily.   CALCIUM & MAGNESIUM CARBONATES PO Take by mouth. Has Zinc/Ca/Mag -Take 3 pills daily   cephALEXin (KEFLEX) 500 MG capsule Take 500 mg by mouth as needed (to prevent cellutitis).   Cholecalciferol (VITAMIN D3) 1000 units CAPS Take by mouth.   COD LIVER OIL PO Take by mouth.   fluticasone (FLONASE) 50 MCG/ACT nasal spray as needed.    furosemide (LASIX) 40 MG tablet Take 40 mg by mouth daily as needed for fluid.   Inulin (FIBER CHOICE) 1.5 g CHEW Take as directed   meloxicam (MOBIC) 15 MG tablet Take 7.5-15 mg by mouth daily.   Misc. Devices MISC Left arm-uses for lipedema   Prenatal Vit-Fe Fumarate-FA (PRENATAL PO) Take by mouth daily.   pseudoephedrine-acetaminophen (TYLENOL SINUS) 30-500 MG TABS tablet Take 1 tablet by mouth as needed.    tamoxifen (NOLVADEX) 20 MG tablet Take 1 tablet by mouth daily.   vitamin E 400 UNIT capsule Take by mouth.   XARELTO 20 MG TABS tablet Take 20 mg by mouth 2 (two) times daily.   No current facility-administered medications for this visit. (Other)   REVIEW OF SYSTEMS: ROS   Positive for: Musculoskeletal, Cardiovascular, Eyes Negative for: Constitutional, Gastrointestinal, Neurological, Skin, Genitourinary, HENT, Endocrine, Respiratory, Psychiatric, Allergic/Imm, Heme/Lymph Last edited by Thompson Grayer, COT on 03/07/2023  7:45 AM.  ALLERGIES Allergies  Allergen Reactions   Codeine Nausea And Vomiting    unknown   PAST MEDICAL HISTORY Past Medical History:  Diagnosis Date   Acute deep vein thrombosis (DVT) of popliteal vein of left lower extremity ([facility]) 07/2021   on xarelto   Allergy    Anemia    Arthritis    Breast cancer ([facility]) 2019   right side   Cancer ([facility]) 1997   left breast cancer/last chemo 01/1997/Radiation June 1998   Chemotherapy-induced peripheral neuropathy (HCC)    Colon polyps    Gallstones    Personal history of chemotherapy    Personal history of radiation therapy    Platelet disorder  (HCC)    low platelets per pt   Pneumonia    Swollen lymph nodes    21 lymph nodes removed left side under armpit   Past Surgical History:  Procedure Laterality Date   ABDOMINAL HYSTERECTOMY  1988   bi lateral mastectomy  10/13/2018   BREAST LUMPECTOMY Left 1998   radiation and chemo   BREAST SURGERY  1998   left breast   CHOLECYSTECTOMY  2008   COLONOSCOPY     TONSILLECTOMY  1975   WRIST FRACTURE SURGERY Right 03/11/2017   FAMILY HISTORY Family History  Problem Relation Age of Onset   COPD Mother    Rectal cancer Father 54   Hypertension Sister    Sarcoidosis Sister    High Cholesterol Sister    Hypertension Sister    High Cholesterol Sister    Hypertension Brother    High Cholesterol Brother    Heart attack Brother    Stroke Brother    Colon cancer Paternal Grandmother    Asthma Daughter    Esophageal cancer Paternal Uncle    Stomach cancer Neg Hx    SOCIAL HISTORY Social History   Tobacco Use   Smoking status: Never   Smokeless tobacco: Never  Vaping Use   Vaping Use: Never used  Substance Use Topics   Alcohol use: No   Drug use: Never       OPHTHALMIC EXAM:  Base Eye Exam     Visual Acuity (Snellen - Linear)       Right Left   Dist [place] 20/20 -1 20/20 -1         Tonometry (Tonopen, 7:49 AM)       Right Left   Pressure 12 11         Pupils       Pupils Dark Light Shape React APD   Right PERRL 3 2 Round Brisk None   Left PERRL 3 2 Round Brisk None         Visual Fields (Counting fingers)       Left Right    Full Full         Extraocular Movement       Right Left    Full, Ortho Full, Ortho         Neuro/Psych     Oriented x3: Yes   Mood/Affect: Normal         Dilation     Both eyes: 1.0% Mydriacyl, 2.5% Phenylephrine @ 7:49 AM           Slit Lamp and Fundus Exam     Slit Lamp Exam       Right Left   Lids/Lashes Dermatochalasis - upper lid Dermatochalasis - upper lid   Conjunctiva/Sclera mild  melanosis, mild nasal and temporal pinguecula mild melanosis, mild  nasal and temporal pinguecula   Cornea well healed cataract wound, tear film debris Trace Punctate epithelial erosions, well healed cataract wound   Anterior Chamber deep and clear deep and clear   Iris Round and dilated Round and dilated   Lens PC IOL in good position PC IOL in good position   Anterior Vitreous mild syneresis mild syneresis, no pigment         Fundus Exam       Right Left   Disc Pink and Sharp, Compact, mild PPA mild Pallor, Sharp rim   C/D Ratio 0.2 0.3   Macula Flat, Good foveal reflex, RPE mottling, No heme or edema Flat, Good foveal reflex, RPE mottling, No heme or edema   Vessels mild attenuation, mild tortuosity mild attenuation, mild tortuosity   Periphery Attached, mild peripheral drusen, No heme Attached, large peripheral retinal hole from 4-5 oclock with surrounding CR scarring -- good laser changes surrounding, light laser changes at anterior horns, no SRF; mild peripheral drusen, No RT/RD            IMAGING AND PROCEDURES  Imaging and Procedures for 03/07/2023  OCT, Retina - OU - Both Eyes       Right Eye Quality was good. Central Foveal Thickness: 246. Progression has been stable. Findings include normal foveal contour, no IRF, no SRF.   Left Eye Quality was good. Central Foveal Thickness: 264. Progression has been stable. Findings include normal foveal contour, no IRF, no SRF (Rare drusen, trace vitreous opacities -- improved).   Notes *Images captured and stored on drive  Diagnosis / Impression:  NFP, no IRF/SRF OU Rare drusen OS, trace vitreous opacities -- improved  Clinical management:  See below  Abbreviations: NFP - Normal foveal profile. CME - cystoid macular edema. PED - pigment epithelial detachment. IRF - intraretinal fluid. SRF - subretinal fluid. EZ - ellipsoid zone. ERM - epiretinal membrane. ORA - outer retinal atrophy. ORT - outer retinal tubulation. SRHM -  subretinal hyper-reflective material. IRHM - intraretinal hyper-reflective material            ASSESSMENT/PLAN:    ICD-10-CM   1. 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 Essential hypertension  I10     3. Hypertensive retinopathy of both eyes  H35.033     4. Pseudophakia, both eyes  Z96.1      Retinal hole, OS - large peripheral retinal hole spanning 4-5 oclock - incidental finding on routine exam by Dr. Zenaida Niece -- pt asymptomatic - s/p laser retinopexy OS (02.20.24) -- good laser changes posteriorly, lighter pigmentation at anterior horns - may need supplemental laser retinopexy - no new RT/RD - f/u in 2-3 weeks -- 945 or 245 laser slot -- DFE, OCT, possible touch up  2,3. Hypertensive retinopathy OU - discussed importance of tight BP control - monitor  4. Pseudophakia OU  - s/p CE/IOL OU  - IOL in good position, doing well  - monitor  Ophthalmic Meds Ordered this visit:  No orders of the defined types were placed in this encounter.    Return for f/u 2-3 weeks -- 945 or 245, retinal hole OS, DFE, OCT.  There are no Patient Instructions on file for this visit.  Explained the diagnoses, plan, and follow up with the patient and they expressed understanding.  Patient expressed understanding of the importance of proper follow up care.   This document serves as a record of services personally performed  by Karie Chimera, MD, PhD. It was created on their behalf by Glee Arvin. Manson Passey, OA an ophthalmic technician. The creation of this record is the provider's dictation and/or activities during the visit.    Electronically signed by: Glee Arvin. Manson Passey, [place] 05.02.2024 3:09 PM  Karie Chimera, M.D., Ph.D. Diseases & Surgery of the Retina and Vitreous [facility]  I have reviewed the above documentation for accuracy and completeness, and I agree with the above. Karie Chimera, M.D., Ph.D. 03/07/23 3:10 PM   Abbreviations: M  myopia (nearsighted); A astigmatism; H hyperopia (farsighted); P presbyopia; Mrx spectacle prescription;  CTL contact lenses; OD right eye; OS left eye; OU both eyes  XT exotropia; ET esotropia; PEK punctate epithelial keratitis; PEE punctate epithelial erosions; DES dry eye syndrome; MGD meibomian gland dysfunction; ATs artificial tears; PFAT's preservative free artificial tears; [place] nuclear sclerotic cataract; PSC posterior subcapsular cataract; ERM epi-retinal membrane; PVD posterior vitreous detachment; RD retinal detachment; DM diabetes mellitus; DR diabetic retinopathy; NPDR non-proliferative diabetic retinopathy; PDR proliferative diabetic retinopathy; CSME clinically significant macular edema; DME diabetic macular edema; dbh dot blot hemorrhages; CWS cotton wool spot; POAG primary open angle glaucoma; C/D cup-to-disc ratio; HVF humphrey visual field; GVF goldmann visual field; OCT optical coherence tomography; IOP intraocular pressure; BRVO Branch retinal vein occlusion; CRVO central retinal vein occlusion; CRAO central retinal artery occlusion; BRAO branch retinal artery occlusion; RT retinal tear; SB scleral buckle; PPV pars plana vitrectomy; VH Vitreous hemorrhage; PRP panretinal laser photocoagulation; IVK intravitreal kenalog; VMT vitreomacular traction; MH Macular hole;  NVD neovascularization of the disc; NVE neovascularization elsewhere; AREDS age related eye disease study; ARMD age related macular degeneration; POAG primary open angle glaucoma; EBMD epithelial/anterior basement membrane dystrophy; ACIOL anterior chamber intraocular lens; IOL intraocular lens; PCIOL posterior chamber intraocular lens; Phaco/IOL phacoemulsification with intraocular lens placement; PRK photorefractive keratectomy; LASIK laser assisted in situ keratomileusis; HTN hypertension; DM diabetes mellitus; COPD chronic obstructive pulmonary disease

## 2023-03-07 ENCOUNTER — Encounter (INDEPENDENT_AMBULATORY_CARE_PROVIDER_SITE_OTHER): Payer: Self-pay | Admitting: Ophthalmology

## 2023-03-07 ENCOUNTER — Ambulatory Visit (INDEPENDENT_AMBULATORY_CARE_PROVIDER_SITE_OTHER): Payer: Medicare Other | Admitting: Ophthalmology

## 2023-03-07 DIAGNOSIS — H33322 Round hole, left eye: Secondary | ICD-10-CM | POA: Diagnosis not present

## 2023-03-07 DIAGNOSIS — I10 Essential (primary) hypertension: Secondary | ICD-10-CM | POA: Diagnosis not present

## 2023-03-07 DIAGNOSIS — H35033 Hypertensive retinopathy, bilateral: Secondary | ICD-10-CM | POA: Diagnosis not present

## 2023-03-07 DIAGNOSIS — Z961 Presence of intraocular lens: Secondary | ICD-10-CM | POA: Diagnosis not present

## 2023-03-12 NOTE — Progress Notes (Signed)
[facility] - Clinic Note  03/20/2023     CHIEF COMPLAINT Patient presents for Retina Follow Up   HISTORY OF PRESENT ILLNESS: [name] is a 77 y.o. female who presents to the clinic today for:   HPI     Retina Follow Up   In left eye.  This started 2 weeks ago.  Duration of 2 weeks.  Since onset it is stable.  I, the attending physician,  performed the HPI with the patient and updated documentation appropriately.        Comments   2 week retina follow up for retina hole os and pexy os pt is reporting no vision changes noticed she denies any flashes or floaters       Last edited by Rennis Chris, MD on 03/20/2023 11:22 AM.    Pt is here for laser retinopexy OS fill in   Referring physician: Barbie Banner, MD [address]  [place] [address]  HISTORICAL INFORMATION:   Selected notes from the MEDICAL RECORD NUMBER Referred by Dr. Zenaida Niece for concern of retinal hole LEE:  Ocular Hx- PMH-    CURRENT MEDICATIONS: Current Outpatient Medications (Ophthalmic Drugs)  Medication Sig   prednisoLONE acetate (PRED FORTE) 1 % ophthalmic suspension Place 1 drop into the left eye 4 (four) times daily for 7 days.   cycloSPORINE (RESTASIS) 0.05 % ophthalmic emulsion Place one drop into both eyes 2 (two) times daily.   No current facility-administered medications for this visit. (Ophthalmic Drugs)   Current Outpatient Medications (Other)  Medication Sig   acetaminophen (TYLENOL) 325 MG tablet Take 2 tablets (650 mg total) by mouth every 4 (four) hours as needed for headache or mild pain. (Patient taking differently: Take 650 mg by mouth as needed for mild pain or headache.)   albuterol (VENTOLIN HFA) 108 (90 Base) MCG/ACT inhaler Inhale into the lungs.   amLODipine (NORVASC) 5 MG tablet Take 5 mg by mouth daily.   Ascorbic Acid (VITAMIN C) 1000 MG tablet Take by mouth.   b complex vitamins capsule Take 1 capsule by mouth daily.   BETA  CAROTENE PO Take by mouth daily.   CALCIUM & MAGNESIUM CARBONATES PO Take by mouth. Has Zinc/Ca/Mag -Take 3 pills daily   cephALEXin (KEFLEX) 500 MG capsule Take 500 mg by mouth as needed (to prevent cellutitis).   Cholecalciferol (VITAMIN D3) 1000 units CAPS Take by mouth.   COD LIVER OIL PO Take by mouth.   fluticasone (FLONASE) 50 MCG/ACT nasal spray as needed.    furosemide (LASIX) 40 MG tablet Take 40 mg by mouth daily as needed for fluid.   Inulin (FIBER CHOICE) 1.5 g CHEW Take as directed   meloxicam (MOBIC) 15 MG tablet Take 7.5-15 mg by mouth daily.   Misc. Devices MISC Left arm-uses for lipedema   Prenatal Vit-Fe Fumarate-FA (PRENATAL PO) Take by mouth daily.   pseudoephedrine-acetaminophen (TYLENOL SINUS) 30-500 MG TABS tablet Take 1 tablet by mouth as needed.    tamoxifen (NOLVADEX) 20 MG tablet Take 1 tablet by mouth daily.   vitamin E 400 UNIT capsule Take by mouth.   XARELTO 20 MG TABS tablet Take 20 mg by mouth 2 (two) times daily.   No current facility-administered medications for this visit. (Other)   REVIEW OF SYSTEMS: ROS   Positive for: Musculoskeletal, Cardiovascular, Eyes Negative for: Constitutional, Gastrointestinal, Neurological, Skin, Genitourinary, HENT, Endocrine, Respiratory, Psychiatric, Allergic/Imm, Heme/Lymph Last edited by Etheleen Mayhew, COT on 03/20/2023  9:58 AM.     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   Pneumonia    Swollen lymph nodes    21 lymph nodes removed left side under armpit   Past Surgical History:  Procedure Laterality Date   ABDOMINAL HYSTERECTOMY  1988   bi lateral mastectomy  10/13/2018   BREAST LUMPECTOMY Left 1998   radiation and chemo   BREAST SURGERY  1998   left breast   CHOLECYSTECTOMY  2008   COLONOSCOPY     TONSILLECTOMY  1975   WRIST FRACTURE SURGERY Right 03/11/2017   FAMILY HISTORY Family History  Problem Relation Age of Onset   COPD Mother    Rectal cancer Father 32   Hypertension Sister    Sarcoidosis Sister    High Cholesterol Sister    Hypertension Sister    High Cholesterol Sister    Hypertension Brother    High Cholesterol Brother    Heart attack Brother    Stroke Brother    Colon cancer Paternal Grandmother    Asthma Daughter    Esophageal cancer Paternal Uncle    Stomach cancer Neg Hx    SOCIAL HISTORY Social History   Tobacco Use   Smoking status: Never   Smokeless tobacco: Never  Vaping Use   Vaping Use: Never used  Substance Use Topics   Alcohol use: No   Drug use: Never       OPHTHALMIC EXAM:  Base Eye Exam     Visual Acuity (Snellen - Linear)       Right Left   Dist [facility] 20/25 20/25   Dist ph [place] NI NI         Tonometry (Tonopen, 10:02 AM)       Right Left   Pressure 10 12         Pupils       Pupils Dark Light Shape React APD   Right PERRL 3 2 Round Brisk None   Left PERRL 3 2 Round Brisk None         Visual Fields       Left Right    Full Full         Extraocular Movement       Right Left    Full, Ortho Full, Ortho         Neuro/Psych     Oriented x3: Yes   Mood/Affect: Normal         Dilation     Both eyes: 2.5% Phenylephrine @ 10:02 AM           Slit Lamp and Fundus Exam     Slit Lamp Exam       Right Left   Lids/Lashes Dermatochalasis - upper lid Dermatochalasis - upper lid   Conjunctiva/Sclera mild  melanosis, mild  nasal and temporal pinguecula mild melanosis, mild nasal and temporal pinguecula   Cornea well healed cataract wound, tear film debris Trace Punctate epithelial erosions, well healed cataract wound   Anterior Chamber deep and clear deep and clear   Iris Round and dilated Round and dilated   Lens PC IOL in good position PC IOL in good position   Anterior Vitreous mild syneresis mild syneresis, no pigment         Fundus Exam       Right Left   Disc Pink and Sharp, Compact, mild PPA mild Pallor, Sharp rim   C/D Ratio 0.2 0.3   Macula Flat, Good foveal reflex, RPE mottling, No heme or edema Flat, Good foveal reflex, RPE mottling, No heme or edema   Vessels mild attenuation, mild tortuosity mild attenuation, mild tortuosity   Periphery Attached, mild peripheral drusen, No heme Attached, large peripheral retinal hole from 4-5 oclock with surrounding CR scarring -- good laser changes surrounding, light laser changes at anterior horns, no SRF; mild peripheral drusen, No new RT/RD            IMAGING AND PROCEDURES  Imaging and Procedures for 03/20/2023  OCT, Retina - OU - Both Eyes       Right Eye Quality was good. Central Foveal Thickness: 249. Progression has been stable. Findings include normal foveal contour, no IRF, no SRF.   Left Eye Quality was good. Central Foveal Thickness: 267. Progression has been stable. Findings include normal foveal contour, no IRF, no SRF (Rare drusen).   Notes *Images captured and stored on drive  Diagnosis / Impression:  NFP, no IRF/SRF OU Rare drusen OS  Clinical management:  See below  Abbreviations: NFP - Normal foveal profile. CME - cystoid macular edema. PED - pigment epithelial detachment. IRF - intraretinal fluid. SRF - subretinal fluid. EZ - ellipsoid zone. ERM - epiretinal membrane. ORA - outer retinal atrophy. ORT - outer retinal tubulation. SRHM - subretinal hyper-reflective material. IRHM - intraretinal  hyper-reflective material      LASER PROCEDURE NOTE  Procedure:  Supplemental barrier laser retinopexy using laser indirect ophthalmoscope, LEFT eye   Diagnosis:   Retinal hole, LEFT eye                     Large peripheral retinal hole from 0400-0500 o'clock anterior to equator   Surgeon: Rennis Chris, MD, PhD  Anesthesia: Topical  Informed consent obtained, operative eye marked, and time out performed prior to initiation of laser.   Laser settings:  Lumenis [id number] laser indirect ophthalmoscope Power: 280 mW Duration: 70 msec  # spots: 305  Placement of laser: Supplemental laser was placed in three confluent rows around anterior portions of retinal hole spanning 0400-0500.  Complications: None.  Patient tolerated the procedure well and received written and verbal post-procedure care information/education.       ASSESSMENT/PLAN:    ICD-10-CM   1. 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 Essential hypertension  I10     3. Hypertensive retinopathy of both eyes  H35.033 OCT, Retina - OU - Both Eyes    4. Pseudophakia, both eyes  Z96.1      Retinal hole, OS - large peripheral retinal hole spanning 4-5 oclock - incidental finding on routine exam by Dr. Zenaida Niece -- pt asymptomatic - s/p laser retinopexy OS (02.20.24) -- good laser changes posteriorly, lighter pigmentation at anterior horns - no new RT/RD - recommend supplemental laser retinopexy OS  today, 05.29.24 - pt wishes to proceed with laser - RBA of procedure discussed, questions answered - informed consent obtained and signed - see procedure note - start PF QID OS x7d - f/u in 4 weeks, DFE, OCT  2,3. Hypertensive retinopathy OU - discussed importance of tight BP control - monitor  4. Pseudophakia OU  - s/p CE/IOL OU  - IOL in good position, doing well  - monitor  Ophthalmic Meds Ordered this visit:  Meds ordered this encounter  Medications   prednisoLONE acetate (PRED FORTE) 1 % ophthalmic  suspension    Sig: Place 1 drop into the left eye 4 (four) times daily for 7 days.    Dispense:  5 mL    Refill:  0     Return in about 4 weeks (around 04/17/2023) for f/u retinal hole OS, DFE, OCT.  There are no Patient Instructions on file for this visit.  Explained the diagnoses, plan, and follow up with the patient and they expressed understanding.  Patient expressed understanding of the importance of proper follow up care.   This document serves as a record of services personally performed by Karie Chimera, MD, PhD. It was created on their behalf by De Blanch, an ophthalmic technician. The creation of this record is the provider's dictation and/or activities during the visit.    Electronically signed by: De Blanch, OA, 03/20/23  11:22 AM  This document serves as a record of services personally performed by Karie Chimera, MD, PhD. It was created on their behalf by Glee Arvin. Manson Passey, OA an ophthalmic technician. The creation of this record is the provider's dictation and/or activities during the visit.    Electronically signed by: Glee Arvin. [place], [place] 05.29.2024 11:22 AM  Karie Chimera, M.D., Ph.D. Diseases & Surgery of the Retina and Vitreous [facility]  I have reviewed the above documentation for accuracy and completeness, and I agree with the above. Karie Chimera, M.D., Ph.D. 03/20/23 11:40 AM  Abbreviations: M myopia (nearsighted); A astigmatism; H hyperopia (farsighted); P presbyopia; Mrx spectacle prescription;  CTL contact lenses; OD right eye; OS left eye; OU both eyes  XT exotropia; ET esotropia; PEK punctate epithelial keratitis; PEE punctate epithelial erosions; DES dry eye syndrome; MGD meibomian gland dysfunction; ATs artificial tears; PFAT's preservative free artificial tears; NSC nuclear sclerotic cataract; PSC posterior subcapsular cataract; ERM epi-retinal membrane; PVD posterior vitreous detachment; RD retinal detachment; DM  diabetes mellitus; DR diabetic retinopathy; NPDR non-proliferative diabetic retinopathy; PDR proliferative diabetic retinopathy; CSME clinically significant macular edema; DME diabetic macular edema; dbh dot blot hemorrhages; CWS cotton wool spot; POAG primary open angle glaucoma; C/D cup-to-disc ratio; HVF humphrey visual field; GVF goldmann visual field; OCT optical coherence tomography; IOP intraocular pressure; BRVO Branch retinal vein occlusion; CRVO central retinal vein occlusion; CRAO central retinal artery occlusion; BRAO branch retinal artery occlusion; RT retinal tear; SB scleral buckle; PPV pars plana vitrectomy; VH Vitreous hemorrhage; PRP panretinal laser photocoagulation; IVK intravitreal kenalog; VMT vitreomacular traction; MH Macular hole;  NVD neovascularization of the disc; NVE neovascularization elsewhere; AREDS age related eye disease study; ARMD age related macular degeneration; POAG primary open angle glaucoma; EBMD epithelial/anterior basement membrane dystrophy; ACIOL anterior chamber intraocular lens; IOL intraocular lens; PCIOL posterior chamber intraocular lens; Phaco/IOL phacoemulsification with intraocular lens placement; PRK photorefractive keratectomy; LASIK laser assisted in situ keratomileusis; HTN hypertension; DM diabetes mellitus; COPD chronic obstructive pulmonary disease

## 2023-03-20 ENCOUNTER — Ambulatory Visit (INDEPENDENT_AMBULATORY_CARE_PROVIDER_SITE_OTHER): Payer: Medicare Other | Admitting: Ophthalmology

## 2023-03-20 ENCOUNTER — Encounter (INDEPENDENT_AMBULATORY_CARE_PROVIDER_SITE_OTHER): Payer: Self-pay | Admitting: Ophthalmology

## 2023-03-20 DIAGNOSIS — Z961 Presence of intraocular lens: Secondary | ICD-10-CM | POA: Diagnosis not present

## 2023-03-20 DIAGNOSIS — H33322 Round hole, left eye: Secondary | ICD-10-CM

## 2023-03-20 DIAGNOSIS — I10 Essential (primary) hypertension: Secondary | ICD-10-CM

## 2023-03-20 DIAGNOSIS — H35033 Hypertensive retinopathy, bilateral: Secondary | ICD-10-CM

## 2023-03-20 MED ORDER — PREDNISOLONE ACETATE 1 % OP SUSP: 1.0000 [drp] | Freq: Four times a day (QID) | OPHTHALMIC | 0 refills | Status: AC

## 2023-04-09 NOTE — Progress Notes (Signed)
[facility] - Clinic Note  04/17/2023     CHIEF COMPLAINT Patient presents for Retina Follow Up   HISTORY OF PRESENT ILLNESS: [name] is a 77 y.o. female who presents to the clinic today for:   HPI     Retina Follow Up   In left eye.  This started 4 weeks ago.  Duration of 4 weeks.  Since onset it is stable.  I, the attending physician,  performed the HPI with the patient and updated documentation appropriately.        Comments   4 week retina follow up ret holes os pt is reporting no vision changes noticed she denies any flashes or floaters       Last edited by Rennis Chris, MD on 04/19/2023  3:16 AM.    Pt states she had no problems after the laser procedure at last visit   Referring physician: Barbie Banner, MD [address]  [place] [address]  HISTORICAL INFORMATION:   Selected notes from the MEDICAL RECORD NUMBER Referred by Dr. Zenaida Niece for concern of retinal hole LEE:  Ocular Hx- PMH-    CURRENT MEDICATIONS: Current Outpatient Medications (Ophthalmic Drugs)  Medication Sig   cycloSPORINE (RESTASIS) 0.05 % ophthalmic emulsion Place one drop into both eyes 2 (two) times daily.   No current facility-administered medications for this visit. (Ophthalmic Drugs)   Current Outpatient Medications (Other)  Medication Sig   acetaminophen (TYLENOL) 325 MG tablet Take 2 tablets (650 mg total) by mouth every 4 (four) hours as needed for headache or mild pain. (Patient taking differently: Take 650 mg by mouth as needed for mild pain or headache.)   albuterol (VENTOLIN HFA) 108 (90 Base) MCG/ACT inhaler Inhale into the lungs.   amLODipine (NORVASC) 5 MG tablet Take 5 mg by mouth daily.   Ascorbic Acid (VITAMIN C) 1000 MG tablet Take by mouth.   b complex vitamins capsule Take 1 capsule by mouth daily.   BETA CAROTENE PO Take by mouth daily.   CALCIUM & MAGNESIUM CARBONATES PO Take by mouth. Has Zinc/Ca/Mag -Take 3 pills daily    cephALEXin (KEFLEX) 500 MG capsule Take 500 mg by mouth as needed (to prevent cellutitis).   Cholecalciferol (VITAMIN D3) 1000 units CAPS Take by mouth.   COD LIVER OIL PO Take by mouth.   fluticasone (FLONASE) 50 MCG/ACT nasal spray as needed.    furosemide (LASIX) 40 MG tablet Take 40 mg by mouth daily as needed for fluid.   Inulin (FIBER CHOICE) 1.5 g CHEW Take as directed   meloxicam (MOBIC) 15 MG tablet Take 7.5-15 mg by mouth daily.   Misc. Devices MISC Left arm-uses for lipedema   Prenatal Vit-Fe Fumarate-FA (PRENATAL PO) Take by mouth daily.   pseudoephedrine-acetaminophen (TYLENOL SINUS) 30-500 MG TABS tablet Take 1 tablet by mouth as needed.    tamoxifen (NOLVADEX) 20 MG tablet Take 1 tablet by mouth daily.   vitamin E 400 UNIT capsule Take by mouth.   XARELTO 20 MG TABS tablet Take 20 mg by mouth 2 (two) times daily.   No current facility-administered medications for this visit. (Other)   REVIEW OF SYSTEMS: ROS   Positive for: Musculoskeletal, Cardiovascular, Eyes Negative for: Constitutional, Gastrointestinal, Neurological, Skin, Genitourinary, HENT, Endocrine, Respiratory, Psychiatric, Allergic/Imm, Heme/Lymph Last edited by Etheleen Mayhew, COT on 04/17/2023  8:11 AM.      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   Pneumonia    Swollen lymph nodes    21 lymph nodes removed left side under  armpit   Past Surgical History:  Procedure Laterality Date   ABDOMINAL HYSTERECTOMY  1988   bi lateral mastectomy  10/13/2018   BREAST LUMPECTOMY Left 1998   radiation and chemo   BREAST SURGERY  1998   left breast   CHOLECYSTECTOMY  2008   COLONOSCOPY     TONSILLECTOMY  1975   WRIST FRACTURE SURGERY Right 03/11/2017   FAMILY HISTORY Family History  Problem Relation Age of Onset   COPD Mother    Rectal cancer Father 23   Hypertension Sister    Sarcoidosis Sister    High Cholesterol Sister    Hypertension Sister    High Cholesterol Sister    Hypertension Brother    High Cholesterol Brother    Heart attack Brother    Stroke Brother    Colon cancer Paternal Grandmother    Asthma Daughter    Esophageal cancer Paternal Uncle    Stomach cancer Neg Hx    SOCIAL HISTORY Social History   Tobacco Use   Smoking status: Never   Smokeless tobacco: Never  Vaping Use   Vaping Use: Never used  Substance Use Topics   Alcohol use: No   Drug use: Never       OPHTHALMIC EXAM:  Base Eye Exam     Visual Acuity (Snellen - Linear)       Right Left   Dist [place] 20/25 20/25         Tonometry (Tonopen, 8:14 AM)       Right Left   Pressure 13 16         Pupils       Pupils Dark Light Shape React APD   Right PERRL 3 2 Round Brisk None   Left PERRL 3 2 Round Brisk None         Visual Fields       Left Right    Full Full         Extraocular Movement       Right Left    Full, Ortho Full, Ortho         Neuro/Psych     Oriented x3: Yes   Mood/Affect: Normal         Dilation     Both eyes: 2.5% Phenylephrine @ 8:14 AM           Slit Lamp and Fundus Exam     Slit Lamp Exam       Right Left   Lids/Lashes Dermatochalasis - upper lid Dermatochalasis - upper lid   Conjunctiva/Sclera mild melanosis, mild nasal and temporal pinguecula mild melanosis, mild nasal and temporal pinguecula   Cornea well healed cataract wound, tear film debris Trace  Punctate epithelial erosions, well healed cataract wound  Anterior Chamber deep and clear deep and clear   Iris Round and dilated Round and dilated   Lens PC IOL in good position PC IOL in good position   Anterior Vitreous mild syneresis mild syneresis, no pigment         Fundus Exam       Right Left   Disc Pink and Sharp, Compact, mild PPA mild Pallor, Sharp rim   C/D Ratio 0.2 0.3   Macula Flat, Good foveal reflex, RPE mottling, No heme or edema Flat, Good foveal reflex, RPE mottling, No heme or edema   Vessels mild attenuation, mild tortuosity mild attenuation, mild tortuosity   Periphery Attached, mild peripheral drusen, No heme Attached, large peripheral retinal hole from 4-5 oclock with surrounding CR scarring -- good laser changes surrounding, no SRF; mild peripheral drusen, No new RT/RD            IMAGING AND PROCEDURES  Imaging and Procedures for 04/17/2023  OCT, Retina - OU - Both Eyes       Right Eye Quality was good. Central Foveal Thickness: 250. Progression has been stable. Findings include normal foveal contour, no IRF, no SRF.   Left Eye Quality was good. Central Foveal Thickness: 265. Progression has been stable. Findings include normal foveal contour, no IRF, no SRF (Rare drusen).   Notes *Images captured and stored on drive  Diagnosis / Impression:  NFP, no IRF/SRF OU Rare drusen OS  Clinical management:  See below  Abbreviations: NFP - Normal foveal profile. CME - cystoid macular edema. PED - pigment epithelial detachment. IRF - intraretinal fluid. SRF - subretinal fluid. EZ - ellipsoid zone. ERM - epiretinal membrane. ORA - outer retinal atrophy. ORT - outer retinal tubulation. SRHM - subretinal hyper-reflective material. IRHM - intraretinal hyper-reflective material            ASSESSMENT/PLAN:    ICD-10-CM   1. 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 Essential hypertension  I10     3. Hypertensive retinopathy of both eyes  H35.033  OCT, Retina - OU - Both Eyes    4. Pseudophakia, both eyes  Z96.1       Retinal hole, OS - large peripheral retinal hole spanning 4-5 oclock - incidental finding on routine exam by Dr. Zenaida Niece -- pt asymptomatic - s/p laser retinopexy OS (02.20.24), touch up (05.29.24) -- good laser changes surrounding hole - no new RT/RD - RBA of procedure discussed, questions answered - informed consent obtained and signed - see procedure note - f/u in 3 months, DFE, OCT  2,3. Hypertensive retinopathy OU - discussed importance of tight BP control - monitor  4. Pseudophakia OU  - s/p CE/IOL OU  - IOL in good position, doing well  - monitor  Ophthalmic Meds Ordered this visit:  No orders of the defined types were placed in this encounter.    Return in about 3 months (around 07/18/2023) for f/u retinal hole OS, DFE, OCT.  There are no Patient Instructions on file for this visit.  Explained the diagnoses, plan, and follow up with the patient and they expressed understanding.  Patient expressed understanding of the importance of proper follow up care.   This document serves as a record of services personally performed by Karie Chimera, MD, PhD. It was created on their behalf by De Blanch, an ophthalmic technician. The creation of this record is the provider's dictation and/or activities during the visit.    Electronically signed by:  De Blanch, OA, 04/19/23  3:18 AM  This document serves as a record of services personally performed by Karie Chimera, MD, PhD. It was created on their behalf by Glee Arvin. Manson Passey, OA an ophthalmic technician. The creation of this record is the provider's dictation and/or activities during the visit.    Electronically signed by: Glee Arvin. Manson Passey, [place] 06.26.2024 3:18 AM   Karie Chimera, M.D., Ph.D. Diseases & Surgery of the Retina and Vitreous [facility]  I have reviewed the above documentation for accuracy and completeness,  and I agree with the above. Karie Chimera, M.D., Ph.D. 04/19/23 3:19 AM   Abbreviations: M myopia (nearsighted); A astigmatism; H hyperopia (farsighted); P presbyopia; Mrx spectacle prescription;  CTL contact lenses; OD right eye; OS left eye; OU both eyes  XT exotropia; ET esotropia; PEK punctate epithelial keratitis; PEE punctate epithelial erosions; DES dry eye syndrome; MGD meibomian gland dysfunction; ATs artificial tears; PFAT's preservative free artificial tears; NSC nuclear sclerotic cataract; PSC posterior subcapsular cataract; ERM epi-retinal membrane; PVD posterior vitreous detachment; RD retinal detachment; DM diabetes mellitus; DR diabetic retinopathy; NPDR non-proliferative diabetic retinopathy; PDR proliferative diabetic retinopathy; CSME clinically significant macular edema; DME diabetic macular edema; dbh dot blot hemorrhages; CWS cotton wool spot; POAG primary open angle glaucoma; C/D cup-to-disc ratio; HVF humphrey visual field; GVF goldmann visual field; OCT optical coherence tomography; IOP intraocular pressure; BRVO Branch retinal vein occlusion; CRVO central retinal vein occlusion; CRAO central retinal artery occlusion; BRAO branch retinal artery occlusion; RT retinal tear; SB scleral buckle; PPV pars plana vitrectomy; VH Vitreous hemorrhage; PRP panretinal laser photocoagulation; IVK intravitreal kenalog; VMT vitreomacular traction; MH Macular hole;  NVD neovascularization of the disc; NVE neovascularization elsewhere; AREDS age related eye disease study; ARMD age related macular degeneration; POAG primary open angle glaucoma; EBMD epithelial/anterior basement membrane dystrophy; ACIOL anterior chamber intraocular lens; IOL intraocular lens; PCIOL posterior chamber intraocular lens; Phaco/IOL phacoemulsification with intraocular lens placement; PRK photorefractive keratectomy; LASIK laser assisted in situ keratomileusis; HTN hypertension; DM diabetes mellitus; COPD chronic obstructive  pulmonary disease

## 2023-04-17 ENCOUNTER — Ambulatory Visit (INDEPENDENT_AMBULATORY_CARE_PROVIDER_SITE_OTHER): Payer: Medicare Other | Admitting: Ophthalmology

## 2023-04-17 ENCOUNTER — Encounter (INDEPENDENT_AMBULATORY_CARE_PROVIDER_SITE_OTHER): Payer: Self-pay | Admitting: Ophthalmology

## 2023-04-17 DIAGNOSIS — H35033 Hypertensive retinopathy, bilateral: Secondary | ICD-10-CM

## 2023-04-17 DIAGNOSIS — I10 Essential (primary) hypertension: Secondary | ICD-10-CM | POA: Diagnosis not present

## 2023-04-17 DIAGNOSIS — H33322 Round hole, left eye: Secondary | ICD-10-CM | POA: Diagnosis not present

## 2023-04-17 DIAGNOSIS — Z961 Presence of intraocular lens: Secondary | ICD-10-CM | POA: Diagnosis not present

## 2023-04-19 ENCOUNTER — Encounter (INDEPENDENT_AMBULATORY_CARE_PROVIDER_SITE_OTHER): Payer: Self-pay | Admitting: Ophthalmology

## 2023-07-10 NOTE — Progress Notes (Signed)
[facility] - Clinic Note  07/17/2023     CHIEF COMPLAINT Patient presents for Retina Follow Up   HISTORY OF PRESENT ILLNESS: [name] is a 76 y.o. female who presents to the clinic today for:   HPI     Retina Follow Up   Patient presents with  Other (Retinal hole).  In left eye.  Severity is moderate.  Duration of 3 months.  Since onset it is stable.  I, the attending physician,  performed the HPI with the patient and updated documentation appropriately.      Last edited by Rennis Chris, MD on 07/17/2023  2:54 PM.    Pt states vA is the same, no fol or floaters   Referring physician: Barbie Banner, MD [address]  [place] [address]  HISTORICAL INFORMATION:   Selected notes from the MEDICAL RECORD NUMBER Referred by Dr. Zenaida Niece for concern of retinal hole LEE:  Ocular Hx- PMH-    CURRENT MEDICATIONS: Current Outpatient Medications (Ophthalmic Drugs)  Medication Sig   cycloSPORINE (RESTASIS) 0.05 % ophthalmic emulsion Place one drop into both eyes 2 (two) times daily.   No current facility-administered medications for this visit. (Ophthalmic Drugs)   Current Outpatient Medications (Other)  Medication Sig   acetaminophen (TYLENOL) 325 MG tablet Take 2 tablets (650 mg total) by mouth every 4 (four) hours as needed for headache or mild pain. (Patient taking differently: Take 650 mg by mouth as needed for mild pain or headache.)   albuterol (VENTOLIN HFA) 108 (90 Base) MCG/ACT inhaler Inhale into the lungs.   amLODipine (NORVASC) 5 MG tablet Take 5 mg by mouth daily.   Ascorbic Acid (VITAMIN C) 1000 MG tablet Take by mouth.   b complex vitamins capsule Take 1 capsule by mouth daily.   BETA CAROTENE PO Take by mouth daily.   CALCIUM & MAGNESIUM CARBONATES PO Take by mouth. Has Zinc/Ca/Mag -Take 3 pills daily   cephALEXin (KEFLEX) 500 MG capsule Take 500 mg by mouth as needed (to prevent cellutitis).   Cholecalciferol (VITAMIN D3)  1000 units CAPS Take by mouth.   COD LIVER OIL PO Take by mouth.   fluticasone (FLONASE) 50 MCG/ACT nasal spray as needed.    furosemide (LASIX) 40 MG tablet Take 40 mg by mouth daily as needed for fluid.   Inulin (FIBER CHOICE) 1.5 g CHEW Take as directed   meloxicam (MOBIC) 15 MG tablet Take 7.5-15 mg by mouth daily.   Misc. Devices MISC Left arm-uses for lipedema   Prenatal Vit-Fe Fumarate-FA (PRENATAL PO) Take by mouth daily.   pseudoephedrine-acetaminophen (TYLENOL SINUS) 30-500 MG TABS tablet Take 1 tablet by mouth as needed.    tamoxifen (NOLVADEX) 20 MG tablet Take 1 tablet by mouth daily.   vitamin E 400 UNIT capsule Take by mouth.   XARELTO 20 MG TABS tablet Take 20 mg by mouth 2 (two) times daily.   No current facility-administered medications for this visit. (Other)   REVIEW OF SYSTEMS: ROS   Positive for: Musculoskeletal, Cardiovascular, Eyes Negative for: Constitutional, Gastrointestinal, Neurological, Skin, Genitourinary, HENT, Endocrine, Respiratory, Psychiatric, Allergic/Imm, Heme/Lymph Last edited by Doreene Nest, COT on 07/17/2023  7:48 AM.       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   Pneumonia    Swollen lymph nodes    21 lymph nodes removed left side under armpit   Past Surgical History:  Procedure Laterality Date   ABDOMINAL HYSTERECTOMY  1988   bi lateral mastectomy   10/13/2018   BREAST LUMPECTOMY Left 1998   radiation and chemo   BREAST SURGERY  1998   left breast   CHOLECYSTECTOMY  2008   COLONOSCOPY     TONSILLECTOMY  1975   WRIST FRACTURE SURGERY Right 03/11/2017   FAMILY HISTORY Family History  Problem Relation Age of Onset   COPD Mother    Rectal cancer Father 22   Hypertension Sister    Sarcoidosis Sister    High Cholesterol Sister    Hypertension Sister    High Cholesterol Sister    Hypertension Brother    High Cholesterol Brother    Heart attack Brother    Stroke Brother    Colon cancer Paternal Grandmother    Asthma Daughter    Esophageal cancer Paternal Uncle    Stomach cancer Neg Hx    SOCIAL HISTORY Social History   Tobacco Use   Smoking status: Never   Smokeless tobacco: Never  Vaping Use   Vaping status: Never Used  Substance Use Topics   Alcohol use: No   Drug use: Never       OPHTHALMIC EXAM:  Base Eye Exam     Visual Acuity (Snellen - Linear)       Right Left   Dist  20/20 20/20 -2         Tonometry (Tonopen, 7:55 AM)       Right Left   Pressure 08 09         Pupils       Dark Light Shape React APD   Right 3 2 Round Brisk None   Left 3 2 Round Brisk None         Visual Fields (Counting fingers)       Left Right    Full Full         Extraocular Movement       Right Left    Full, Ortho Full, Ortho         Neuro/Psych     Oriented x3: Yes   Mood/Affect: Normal         Dilation     Both eyes: 1.0% Mydriacyl, 2.5% Phenylephrine @ 7:56 AM           Slit Lamp and Fundus Exam     Slit Lamp Exam       Right Left   Lids/Lashes Dermatochalasis - upper lid Dermatochalasis - upper lid   Conjunctiva/Sclera mild melanosis, mild nasal and temporal pinguecula mild melanosis, mild nasal and temporal pinguecula   Cornea well healed cataract wound, tear film debris Trace Punctate epithelial erosions, well healed cataract wound   Anterior Chamber deep and clear deep  and clear   Iris Round and dilated Round and dilated   Lens PC IOL in good  position PC IOL in good position   Anterior Vitreous mild syneresis mild syneresis, no pigment         Fundus Exam       Right Left   Disc Pink and Sharp, Compact, mild PPA mild Pallor, Sharp rim   C/D Ratio 0.2 0.3   Macula Flat, Good foveal reflex, RPE mottling, No heme or edema Flat, Good foveal reflex, RPE mottling, No heme or edema   Vessels attenuated, mild tortuosity mild attenuation, mild tortuosity   Periphery Attached, mild peripheral drusen, No heme Attached, large peripheral retinal hole from 4-5 oclock with surrounding CR scarring -- good laser changes surrounding, no SRF; mild peripheral drusen, No new RT/RD            IMAGING AND PROCEDURES  Imaging and Procedures for 07/17/2023  OCT, Retina - OU - Both Eyes       Right Eye Quality was good. Central Foveal Thickness: 250. Progression has been stable. Findings include normal foveal contour, no IRF, no SRF.   Left Eye Quality was good. Central Foveal Thickness: 268. Progression has been stable. Findings include normal foveal contour, no IRF, no SRF (Rare drusen).   Notes *Images captured and stored on drive  Diagnosis / Impression:  NFP, no IRF/SRF OU Rare drusen OS  Clinical management:  See below  Abbreviations: NFP - Normal foveal profile. CME - cystoid macular edema. PED - pigment epithelial detachment. IRF - intraretinal fluid. SRF - subretinal fluid. EZ - ellipsoid zone. ERM - epiretinal membrane. ORA - outer retinal atrophy. ORT - outer retinal tubulation. SRHM - subretinal hyper-reflective material. IRHM - intraretinal hyper-reflective material             ASSESSMENT/PLAN:    ICD-10-CM   1. 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 Essential hypertension  I10     3. Hypertensive retinopathy of both eyes  H35.033 OCT, Retina - OU - Both Eyes    4. Pseudophakia, both eyes  Z96.1       Retinal hole, OS - large peripheral retinal hole spanning 4-5 oclock - incidental finding on routine exam by Dr. Zenaida Niece -- pt asymptomatic - s/p laser retinopexy OS (02.20.24), touch up (05.29.24) -- good laser changes surrounding hole - no new RT/RD - f/u in 1 year, DFE, OCT  2,3. Hypertensive retinopathy OU - discussed importance of tight BP control - monitor  4. Pseudophakia OU  - s/p CE/IOL OU  - IOL in good position, doing well  - monitor  Ophthalmic Meds Ordered this visit:  No orders of the defined types were placed in this encounter.    Return in about 1 year (around 07/16/2024) for f/u retinal hole OS, DFE, OCT.  There are no Patient Instructions on file for this visit.  Explained the diagnoses, plan, and follow up with the patient and they expressed understanding.  Patient expressed understanding of the importance of proper follow up care.   This document serves as a record of services personally performed by Karie Chimera, MD, PhD. It was created on their behalf by De Blanch, an ophthalmic technician. The creation of this record is the provider's dictation and/or activities during the visit.    Electronically signed by: De Blanch, OA, 07/17/23  2:55 PM  This document serves as a record of services personally performed by Karie Chimera, MD, PhD. It was created on their behalf by Glee Arvin. Manson Passey, OA an  ophthalmic technician. The creation of this record is the provider's dictation and/or activities during the visit.    Electronically signed by: Glee Arvin. Manson Passey, OA 07/17/23 2:55 PM   Karie Chimera, M.D., Ph.D. Diseases & Surgery of the Retina and Vitreous [facility]  I have reviewed the above documentation for accuracy and completeness, and I agree with the above. Karie Chimera, M.D., Ph.D. 07/17/23 2:55 PM   Abbreviations: M myopia (nearsighted); A astigmatism; H hyperopia (farsighted); P presbyopia; Mrx spectacle  prescription;  CTL contact lenses; OD right eye; OS left eye; OU both eyes  XT exotropia; ET esotropia; PEK punctate epithelial keratitis; PEE punctate epithelial erosions; DES dry eye syndrome; MGD meibomian gland dysfunction; ATs artificial tears; PFAT's preservative free artificial tears; NSC nuclear sclerotic cataract; PSC posterior subcapsular cataract; ERM epi-retinal membrane; PVD posterior vitreous detachment; RD retinal detachment; DM diabetes mellitus; DR diabetic retinopathy; NPDR non-proliferative diabetic retinopathy; PDR proliferative diabetic retinopathy; CSME clinically significant macular edema; DME diabetic macular edema; dbh dot blot hemorrhages; CWS cotton wool spot; POAG primary open angle glaucoma; C/D cup-to-disc ratio; HVF humphrey visual field; GVF goldmann visual field; OCT optical coherence tomography; IOP intraocular pressure; BRVO Branch retinal vein occlusion; CRVO central retinal vein occlusion; CRAO central retinal artery occlusion; BRAO branch retinal artery occlusion; RT retinal tear; SB scleral buckle; PPV pars plana vitrectomy; VH Vitreous hemorrhage; PRP panretinal laser photocoagulation; IVK intravitreal kenalog; VMT vitreomacular traction; MH Macular hole;  NVD neovascularization of the disc; NVE neovascularization elsewhere; AREDS age related eye disease study; ARMD age related macular degeneration; POAG primary open angle glaucoma; EBMD epithelial/anterior basement membrane dystrophy; ACIOL anterior chamber intraocular lens; IOL intraocular lens; PCIOL posterior chamber intraocular lens; Phaco/IOL phacoemulsification with intraocular lens placement; PRK photorefractive keratectomy; LASIK laser assisted in situ keratomileusis; HTN hypertension; DM diabetes mellitus; COPD chronic obstructive pulmonary disease

## 2023-07-17 ENCOUNTER — Encounter (INDEPENDENT_AMBULATORY_CARE_PROVIDER_SITE_OTHER): Payer: Self-pay | Admitting: Ophthalmology

## 2023-07-17 ENCOUNTER — Ambulatory Visit (INDEPENDENT_AMBULATORY_CARE_PROVIDER_SITE_OTHER): Payer: Medicare Other | Admitting: Ophthalmology

## 2023-07-17 DIAGNOSIS — I10 Essential (primary) hypertension: Secondary | ICD-10-CM | POA: Diagnosis not present

## 2023-07-17 DIAGNOSIS — Z961 Presence of intraocular lens: Secondary | ICD-10-CM | POA: Diagnosis not present

## 2023-07-17 DIAGNOSIS — H35033 Hypertensive retinopathy, bilateral: Secondary | ICD-10-CM | POA: Diagnosis not present

## 2023-07-17 DIAGNOSIS — H33322 Round hole, left eye: Secondary | ICD-10-CM | POA: Diagnosis not present

## 2024-06-05 ENCOUNTER — Other Ambulatory Visit (INDEPENDENT_AMBULATORY_CARE_PROVIDER_SITE_OTHER): Payer: Self-pay | Admitting: Ophthalmology

## 2024-07-09 NOTE — Progress Notes (Signed)
 [facility] - Clinic Note  07/15/2024     CHIEF COMPLAINT Patient presents for Retina Follow Up   HISTORY OF PRESENT ILLNESS: [name] is a 78 y.o. female who presents to the clinic today for:   HPI     Retina Follow Up   Patient presents with  Other.  In left eye.  This started 1 year ago.  I, the attending physician,  performed the HPI with the patient and updated documentation appropriately.        Comments   Patient here for 1 year retina follow up for retina hole OS. Patient states vision is ok. No eye pain.       Last edited by Melissa Rogue, MD on 07/19/2024  2:54 AM.     Pt states she is still seeing Dr. Fleeta. No vision issues. Following up w/ them in Feb 2026   Referring physician: [name], MD [address] [place],  [place] [address]  HISTORICAL INFORMATION:   Selected notes from the MEDICAL RECORD NUMBER Referred by Dr. Fleeta for concern of retinal hole LEE:  Ocular Hx- PMH-    CURRENT MEDICATIONS: Current Outpatient Medications (Ophthalmic Drugs)  Medication Sig   cycloSPORINE (RESTASIS) 0.05 % ophthalmic emulsion Place one drop into both eyes 2 (two) times daily.   No current facility-administered medications for this visit. (Ophthalmic Drugs)   Current Outpatient Medications (Other)  Medication Sig   albuterol (VENTOLIN HFA) 108 (90 Base) MCG/ACT inhaler Inhale into the lungs.   amLODipine (NORVASC) 5 MG tablet Take 5 mg by mouth daily.   Ascorbic Acid (VITAMIN C) 1000 MG tablet Take by mouth.   b complex vitamins capsule Take 1 capsule by mouth daily.   BETA CAROTENE PO Take by mouth daily.   CALCIUM & MAGNESIUM CARBONATES PO Take by mouth. Has Zinc/Ca/Mag -Take 3 pills daily   cephALEXin (KEFLEX) 500 MG capsule Take 500 mg by mouth as needed (to prevent cellutitis).   Cholecalciferol (VITAMIN D3) 1000 units CAPS Take by mouth.   COD LIVER OIL PO Take by mouth.   fluticasone (FLONASE) 50 MCG/ACT nasal spray  as needed.    furosemide (LASIX) 40 MG tablet Take 40 mg by mouth daily as needed for fluid.   Inulin (FIBER CHOICE) 1.5 g CHEW Take as directed   Prenatal Vit-Fe Fumarate-FA (PRENATAL PO) Take by mouth daily.   pseudoephedrine-acetaminophen  (TYLENOL  SINUS) 30-500 MG TABS tablet Take 1 tablet by mouth as needed.    tamoxifen (NOLVADEX) 20 MG tablet Take 1 tablet by mouth daily.   vitamin E 400 UNIT capsule Take by mouth.   XARELTO 20 MG TABS tablet Take 20 mg by mouth 2 (two) times daily.   acetaminophen  (TYLENOL ) 325 MG tablet Take 2 tablets (650 mg total) by mouth every 4 (four) hours as needed for headache or mild pain. (Patient taking differently: Take 650 mg by mouth as needed for mild pain or headache.)   meloxicam (MOBIC) 15 MG tablet Take 7.5-15 mg by mouth daily. (Patient not taking: Reported on 07/15/2024)   Misc. Devices MISC Left arm-uses for lipedema (Patient not taking: Reported on 07/15/2024)   No current facility-administered medications for this visit. (Other)   REVIEW OF SYSTEMS: ROS   Positive for: Musculoskeletal, Cardiovascular, Eyes Negative for: Constitutional, Gastrointestinal, Neurological, Skin, Genitourinary, HENT, Endocrine, Respiratory, Psychiatric, Allergic/Imm, Heme/Lymph Last edited by Orval Asberry RAMAN, COA on 07/15/2024  9:11 AM.        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    Pneumonia    Swollen lymph nodes    21 lymph nodes removed left side under armpit   Past Surgical History:  Procedure Laterality Date   ABDOMINAL HYSTERECTOMY  1988   bi lateral mastectomy  10/13/2018   BREAST LUMPECTOMY Left 1998   radiation and chemo   BREAST SURGERY  1998   left breast   CHOLECYSTECTOMY  2008   COLONOSCOPY     TONSILLECTOMY  1975   WRIST FRACTURE SURGERY Right 03/11/2017   FAMILY HISTORY Family History  Problem Relation Age of Onset   COPD Mother    Rectal cancer Father 33   Hypertension Sister    Sarcoidosis Sister    High Cholesterol Sister    Hypertension Sister    High Cholesterol Sister    Hypertension Brother    High Cholesterol Brother    Heart attack Brother    Stroke Brother    Colon cancer Paternal Grandmother    Asthma Daughter    Esophageal cancer Paternal Uncle    Stomach cancer Neg Hx    SOCIAL HISTORY Social History   Tobacco Use   Smoking status: Never   Smokeless tobacco: Never  Vaping Use   Vaping status: Never Used  Substance Use Topics   Alcohol use: No   Drug use: Never       OPHTHALMIC EXAM:  Base Eye Exam     Visual Acuity (Snellen - Linear)       Right Left   Dist [facility] 20/20 -2 20/20 -1         Tonometry (Tonopen, 9:08 AM)       Right Left   Pressure 15 12         Pupils       Dark Light Shape React APD   Right 3 2 Round Brisk None   Left 3 2 Round Brisk None         Visual Fields (Counting fingers)       Left Right    Full Full         Extraocular Movement       Right Left    Full, Ortho Full, Ortho         Neuro/Psych     Oriented x3: Yes   Mood/Affect: Normal         Dilation     Both eyes: 1.0% Mydriacyl, 2.5% Phenylephrine @ 9:08 AM           Slit Lamp and Fundus Exam     Slit Lamp Exam       Right Left   Lids/Lashes Dermatochalasis - upper lid Dermatochalasis - upper lid   Conjunctiva/Sclera mild melanosis, mild nasal and temporal pinguecula mild  melanosis, mild nasal and temporal pinguecula   Cornea  well healed cataract wound, tear film debris, mild arcus Trace Punctate epithelial erosions, tear film debris, well healed cataract wound   Anterior Chamber deep and clear deep and clear   Iris Round and dilated Round and dilated   Lens PC IOL in good position PC IOL in good position   Anterior Vitreous mild syneresis mild syneresis, no pigment         Fundus Exam       Right Left   Disc Pink and Sharp, Compact, mild PPA mild Pallor, Sharp rim   C/D Ratio 0.2 0.3   Macula Flat, Good foveal reflex, RPE mottling, No heme or edema Flat, Good foveal reflex, RPE mottling, No heme or edema   Vessels attenuated, mild tortuosity mild attenuation, mild tortuosity   Periphery Attached, mild peripheral drusen, No heme Attached, large peripheral retinal hole from 4-5 oclock with surrounding CR scarring -- good laser changes surrounding, no SRF; mild peripheral drusen, No new RT/RD            IMAGING AND PROCEDURES  Imaging and Procedures for 07/15/2024  OCT, Retina - OU - Both Eyes       Right Eye Quality was good. Central Foveal Thickness: 249. Progression has been stable. Findings include normal foveal contour, no IRF, no SRF.   Left Eye Quality was good. Central Foveal Thickness: 268. Progression has been stable. Findings include normal foveal contour, no IRF, no SRF (Rare drusen).   Notes *Images captured and stored on drive  Diagnosis / Impression:  NFP, no IRF/SRF OU Rare drusen OS  Clinical management:  See below  Abbreviations: NFP - Normal foveal profile. CME - cystoid macular edema. PED - pigment epithelial detachment. IRF - intraretinal fluid. SRF - subretinal fluid. EZ - ellipsoid zone. ERM - epiretinal membrane. ORA - outer retinal atrophy. ORT - outer retinal tubulation. SRHM - subretinal hyper-reflective material. IRHM - intraretinal hyper-reflective material            ASSESSMENT/PLAN:    ICD-10-CM    1. 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 Essential hypertension  I10     3. Hypertensive retinopathy of both eyes  H35.033 OCT, Retina - OU - Both Eyes    4. Pseudophakia, both eyes  Z96.1      Retinal hole, OS - large peripheral retinal hole spanning 4-5 oclock - incidental finding on routine exam by Dr. Fleeta -- pt asymptomatic - s/p laser retinopexy OS (02.20.24), touch up (05.29.24) -- good laser changes surrounding hole - no new RT/RD - pt is cleared from a retina standpoint for release to Dr. Fleeta and resumption of primary eye care  - f/u here PRN  2,3. Hypertensive retinopathy OU - discussed importance of tight BP control - monitor, PRN  4. Pseudophakia OU  - s/p CE/IOL OU  - IOL in good position, doing well  - monitor, PRN  Ophthalmic Meds Ordered this visit:  No orders of the defined types were placed in this encounter.    Return if symptoms worsen or fail to improve.  There are no Patient Instructions on file for this visit.  Explained the diagnoses, plan, and follow up with the patient and they expressed understanding.  Patient expressed understanding of the importance of proper follow up care.   This document serves as a record of services personally performed by Redell JUDITHANN Hans, MD, PhD. It was created on their behalf by Almetta Pesa, an ophthalmic technician. The creation of this  record is the provider's dictation and/or activities during the visit.    Electronically signed by: Almetta Pesa, OA, 07/19/24  2:56 AM  Redell JUDITHANN Hans, M.D., Ph.D. Diseases & Surgery of the Retina and Vitreous [facility]  I have reviewed the above documentation for accuracy and completeness, and I agree with the above. Redell JUDITHANN Hans, M.D., Ph.D. 07/19/24 2:58 AM   Abbreviations: M myopia (nearsighted); A astigmatism; H hyperopia (farsighted); P presbyopia; Mrx spectacle prescription;  CTL contact lenses; OD right eye; OS left eye; OU both eyes   XT exotropia; ET esotropia; PEK punctate epithelial keratitis; PEE punctate epithelial erosions; DES dry eye syndrome; MGD meibomian gland dysfunction; ATs artificial tears; PFAT's preservative free artificial tears; NSC nuclear sclerotic cataract; PSC posterior subcapsular cataract; ERM epi-retinal membrane; PVD posterior vitreous detachment; RD retinal detachment; DM diabetes mellitus; DR diabetic retinopathy; NPDR non-proliferative diabetic retinopathy; PDR proliferative diabetic retinopathy; CSME clinically significant macular edema; DME diabetic macular edema; dbh dot blot hemorrhages; CWS cotton wool spot; POAG primary open angle glaucoma; C/D cup-to-disc ratio; HVF humphrey visual field; GVF goldmann visual field; OCT optical coherence tomography; IOP intraocular pressure; BRVO Branch retinal vein occlusion; CRVO central retinal vein occlusion; CRAO central retinal artery occlusion; BRAO branch retinal artery occlusion; RT retinal tear; SB scleral buckle; PPV pars plana vitrectomy; VH Vitreous hemorrhage; PRP panretinal laser photocoagulation; IVK intravitreal kenalog; VMT vitreomacular traction; MH Macular hole;  NVD neovascularization of the disc; NVE neovascularization elsewhere; AREDS age related eye disease study; ARMD age related macular degeneration; POAG primary open angle glaucoma; EBMD epithelial/anterior basement membrane dystrophy; ACIOL anterior chamber intraocular lens; IOL intraocular lens; PCIOL posterior chamber intraocular lens; Phaco/IOL phacoemulsification with intraocular lens placement; PRK photorefractive keratectomy; LASIK laser assisted in situ keratomileusis; HTN hypertension; DM diabetes mellitus; COPD chronic obstructive pulmonary disease

## 2024-07-15 ENCOUNTER — Ambulatory Visit (INDEPENDENT_AMBULATORY_CARE_PROVIDER_SITE_OTHER): Payer: Medicare Other | Admitting: Ophthalmology

## 2024-07-15 ENCOUNTER — Encounter (INDEPENDENT_AMBULATORY_CARE_PROVIDER_SITE_OTHER): Payer: Self-pay | Admitting: Ophthalmology

## 2024-07-15 DIAGNOSIS — H33322 Round hole, left eye: Secondary | ICD-10-CM | POA: Diagnosis not present

## 2024-07-15 DIAGNOSIS — Z961 Presence of intraocular lens: Secondary | ICD-10-CM

## 2024-07-15 DIAGNOSIS — H35033 Hypertensive retinopathy, bilateral: Secondary | ICD-10-CM

## 2024-07-15 DIAGNOSIS — I10 Essential (primary) hypertension: Secondary | ICD-10-CM | POA: Diagnosis not present

## 2024-07-19 ENCOUNTER — Encounter (INDEPENDENT_AMBULATORY_CARE_PROVIDER_SITE_OTHER): Payer: Self-pay | Admitting: Ophthalmology
